# Patient Record
Sex: Female | Born: 1975 | Race: White | Hispanic: No | Marital: Married | Smoking: Never smoker
Health system: Southern US, Community
[De-identification: ages and names within clinical notes are randomized; demographics above are authoritative.]

## PROBLEM LIST (undated history)

## (undated) DIAGNOSIS — T7840XA Allergy, unspecified, initial encounter: Secondary | ICD-10-CM

## (undated) DIAGNOSIS — C50919 Malignant neoplasm of unspecified site of unspecified female breast: Secondary | ICD-10-CM

## (undated) HISTORY — DX: Malignant neoplasm of unspecified site of unspecified female breast: C50.919

## (undated) HISTORY — PX: BREAST SURGERY: SHX581

## (undated) HISTORY — DX: Allergy, unspecified, initial encounter: T78.40XA

## (undated) HISTORY — PX: MASTECTOMY: SHX3

---

## 2010-11-19 ENCOUNTER — Encounter
Admission: RE | Admit: 2010-11-19 | Discharge: 2010-11-19 | Payer: Self-pay | Source: Home / Self Care | Attending: Family Medicine | Admitting: Family Medicine

## 2011-09-18 IMAGING — CT CT CHEST W/ CM
3 of 4 series · 17 of 30 positions shown, 19 images · IV contrast (75CC OMNI 300)
Comparison: None

CLINICAL DATA: The supraclavicular lymph nodes.  History of breast
cancer.

CT CHEST WITH CONTRAST
TECHNIQUE: Multidetector CT imaging of the chest was performed
following the standard protocol during bolus administration of
intravenous contrast.
Contrast: 75 ml [id number].

[Series 3: routine chest · axial · 0.70mm/px · z∈[-202,+18]mm · 5 of 68 slices shown, 7 images]
[im 12/68  mediastinal]
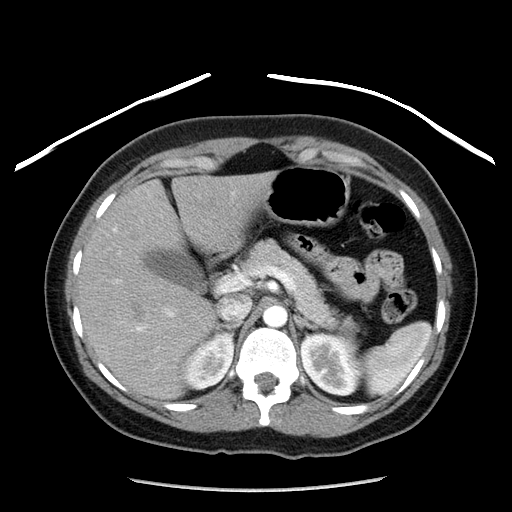
[im 12/68  lung]
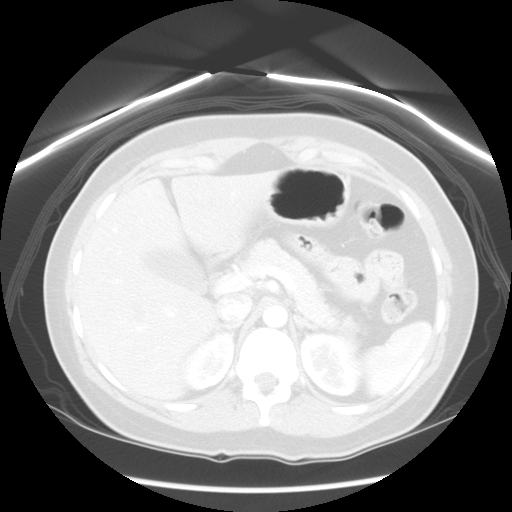
[im 23/68  lung]
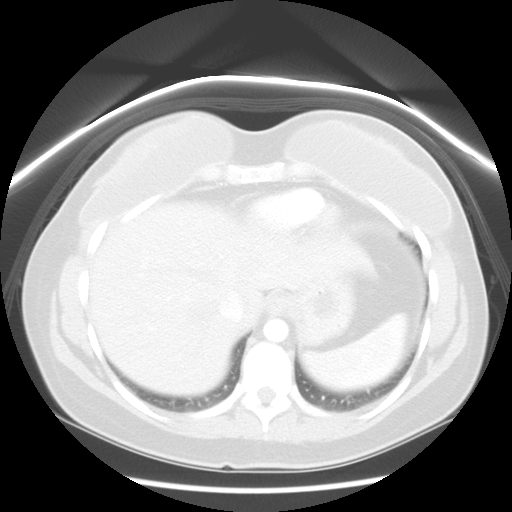
[im 34/68  lung]
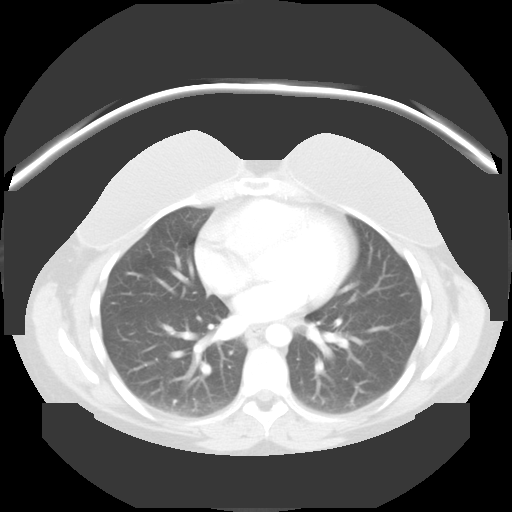
[im 45/68  lung]
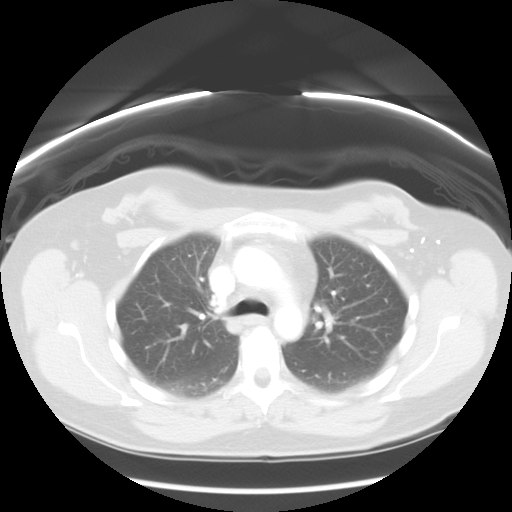
[im 56/68  mediastinal]
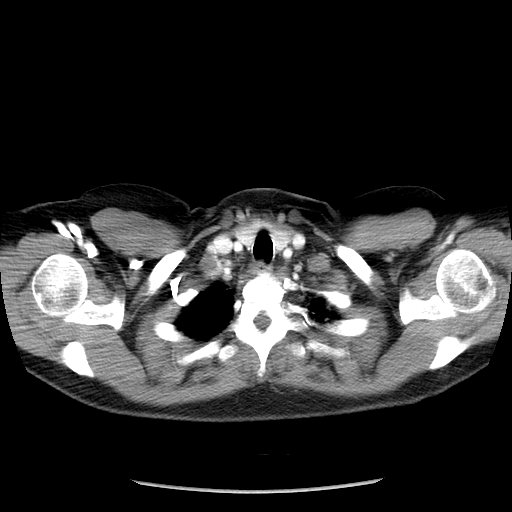
[im 56/68  lung]
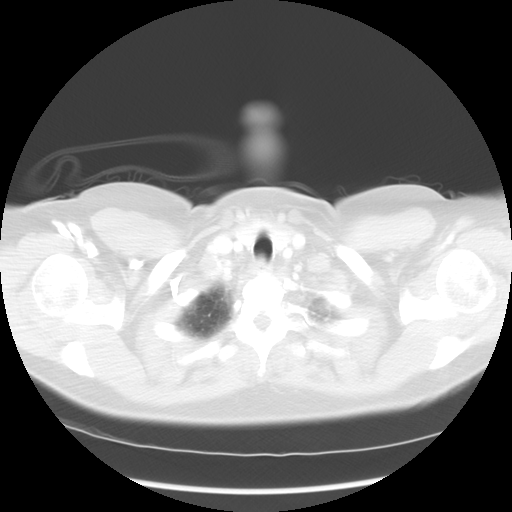

[Series 4: lung windows · axial · 0.70mm/px · z∈[-147,+18]mm · 4 of 57 slices shown]
[im 12/57  lung]
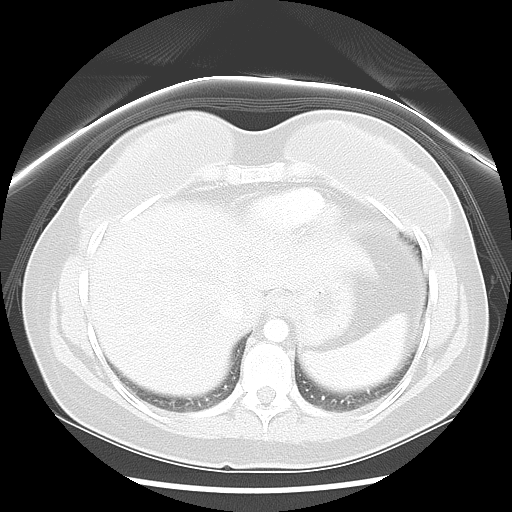
[im 23/57  lung]
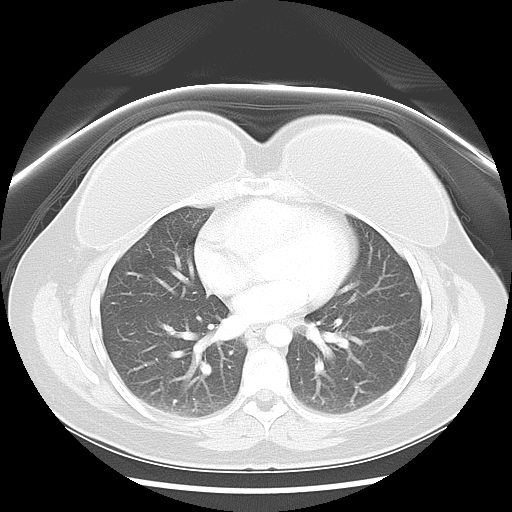
[im 34/57  lung]
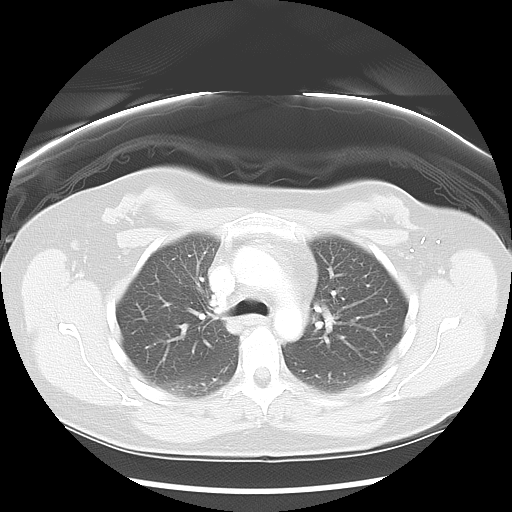
[im 45/57  lung]
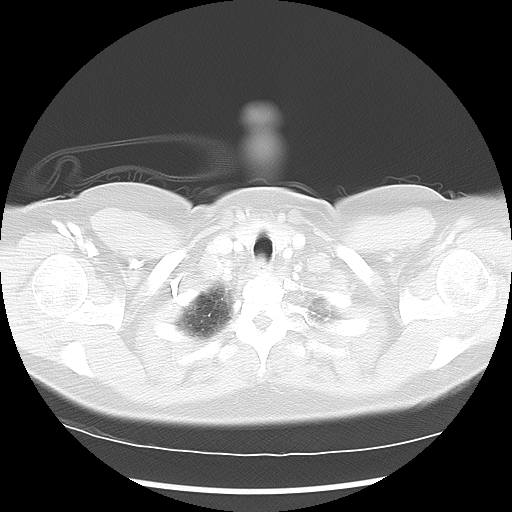

[Series 602: sagittal body · sagittal · 0.70mm/px · 8 of 145 slices shown]
[im 11/145  mediastinal]
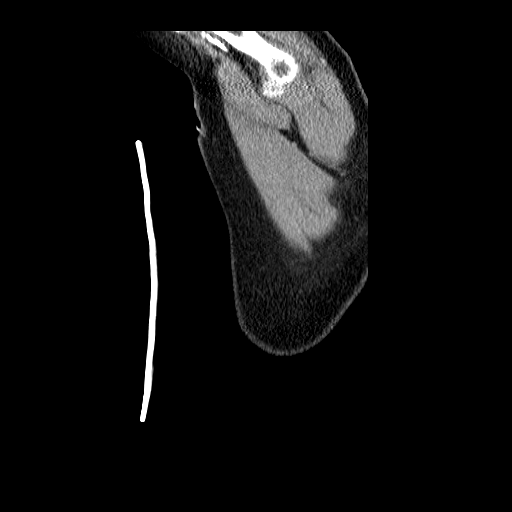
[im 31/145  mediastinal]
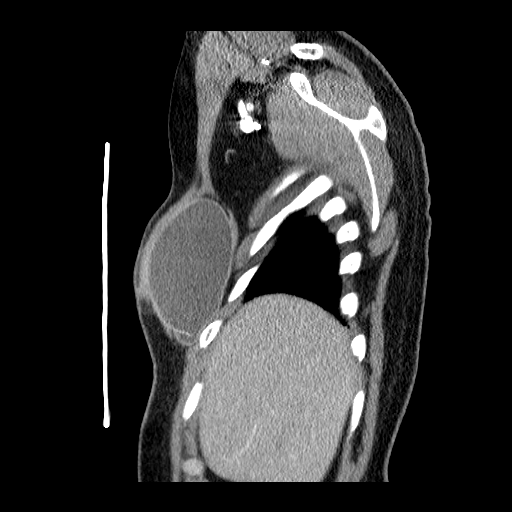
[im 52/145  mediastinal]
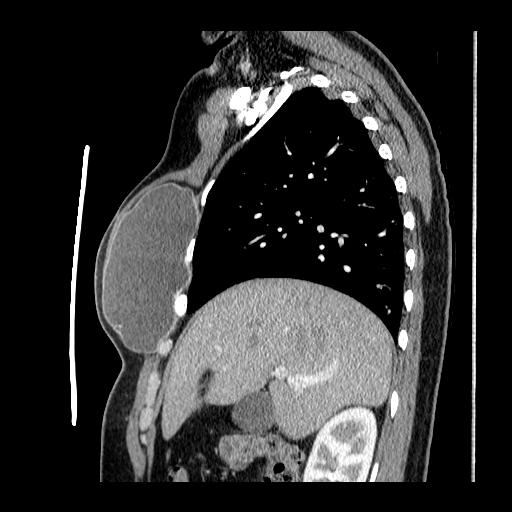
[im 62/145  mediastinal]
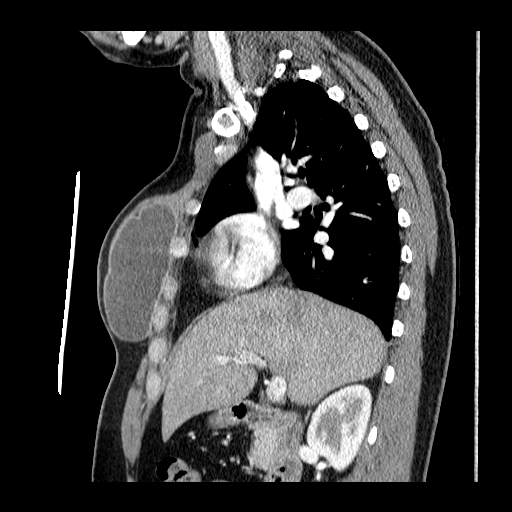
[im 83/145  mediastinal]
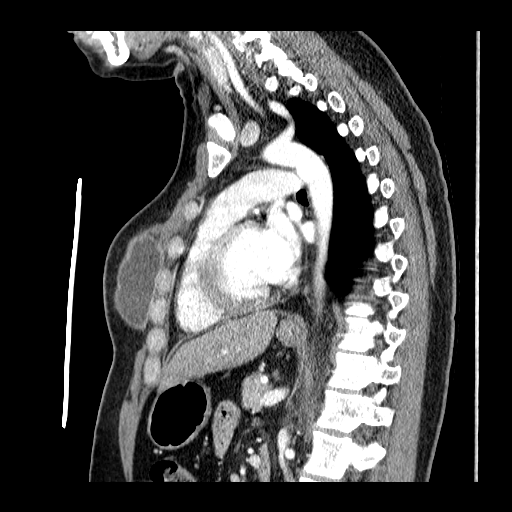
[im 93/145  mediastinal]
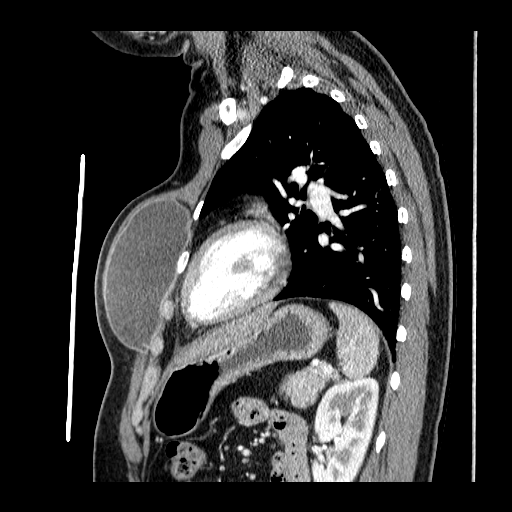
[im 114/145  mediastinal]
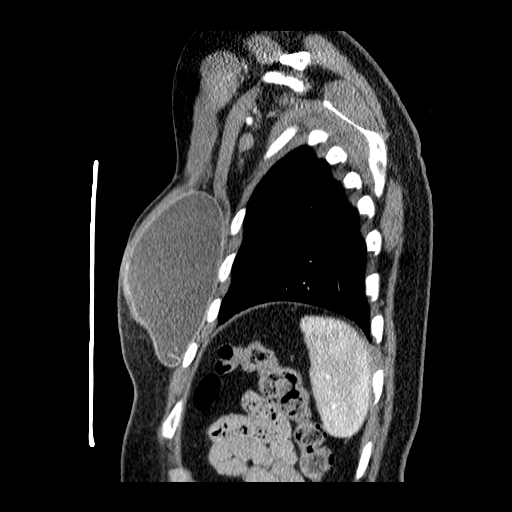
[im 134/145  mediastinal]
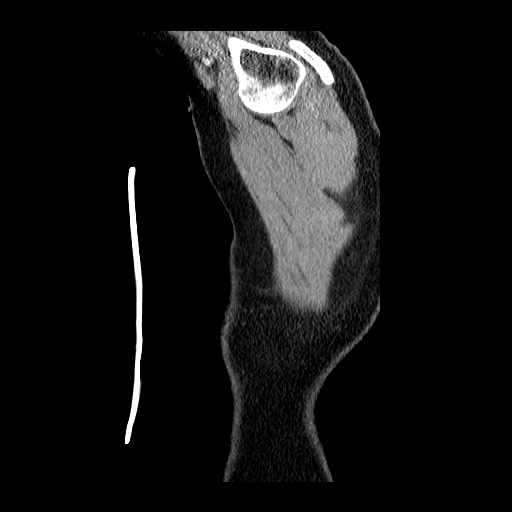

[17 of 30 positions shown; findings below may reference images not displayed]

FINDINGS: There are a few small left-sided lower cervical and
supraclavicular lymph nodes but no adenopathy.  The thyroid gland
appears normal.  No axillary lymphadenopathy.  Surgical changes are
noted in the left axilla.  There are bilateral breast prosthesis.

The bony thorax is intact.  No destructive bony lesions.

The heart is normal in size.  No pericardial effusion.  No
mediastinal or hilar adenopathy.  The aorta is normal in caliber.
No dissection.  The major branch vessels are normal.  The esophagus
is unremarkable.

Examination of the lung parenchyma demonstrates no acute pulmonary
findings.  No mass lesions.  No pleural effusion.

The upper abdomen demonstrates diffuse fatty infiltration of the
liver but no focal hepatic lesions.  No upper abdominal adenopathy.
IMPRESSION: 1.  Small left-sided lower cervical and supraclavicular lymph nodes
but no adenopathy.
2.  Surgical changes in the left axilla without findings for mass
or adenopathy.
3.  No acute pulmonary findings.

## 2013-12-08 ENCOUNTER — Ambulatory Visit (INDEPENDENT_AMBULATORY_CARE_PROVIDER_SITE_OTHER): Payer: BC Managed Care – PPO | Admitting: General Practice

## 2013-12-08 ENCOUNTER — Encounter: Payer: Self-pay | Admitting: General Practice

## 2013-12-08 VITALS — BP 116/76 | HR 89 | Temp 98.5°F | Ht 63.0 in | Wt 166.5 lb

## 2013-12-08 DIAGNOSIS — J0100 Acute maxillary sinusitis, unspecified: Secondary | ICD-10-CM

## 2013-12-08 MED ORDER — AZITHROMYCIN 250 MG PO TABS: ORAL_TABLET | ORAL | Status: DC

## 2013-12-08 NOTE — Patient Instructions (Addendum)

## 2013-12-08 NOTE — Progress Notes (Signed)
   Subjective:    Patient ID: Erica Murphy, female    DOB: 1975/11/05, 38 y.o.   MRN: [id number]  Sinusitis This is a new problem. The current episode started yesterday. The problem has been gradually worsening since onset. The maximum temperature recorded prior to her arrival was 100 - 100.9 F. Associated symptoms include congestion and sinus pressure. Pertinent negatives include no chills, coughing, headaches or shortness of breath. Past treatments include nothing.      Review of Systems  Constitutional: Positive for fever. Negative for chills.  HENT: Positive for congestion and sinus pressure.   Respiratory: Negative for cough, chest tightness and shortness of breath.   Cardiovascular: Negative for chest pain and palpitations.  Neurological: Negative for dizziness, weakness and headaches.       Objective:   Physical Exam  Constitutional: She appears well-developed and well-nourished.  HENT:  Head: Normocephalic and atraumatic.  Nose: Right sinus exhibits maxillary sinus tenderness and frontal sinus tenderness. Left sinus exhibits maxillary sinus tenderness and frontal sinus tenderness.  Mouth/Throat: Posterior oropharyngeal erythema present.  Cardiovascular: Normal rate, regular rhythm and normal heart sounds.   No murmur heard. Pulmonary/Chest: Effort normal and breath sounds normal. No respiratory distress. She exhibits no tenderness.  Neurological: She is alert.  Skin: Skin is warm and dry. No rash noted.  Psychiatric: She has a normal mood and affect.          Assessment & Plan:  1. Sinusitis, acute maxillary  - azithromycin (ZITHROMAX) 250 MG tablet; Take as directed  Dispense: 6 tablet; Refill: 0 -increase fluids -rest -RTO if symptoms worsen or unresolved -Patient verbalized understanding Erby Pian, FNP-C

## 2014-05-30 ENCOUNTER — Ambulatory Visit (INDEPENDENT_AMBULATORY_CARE_PROVIDER_SITE_OTHER): Payer: BC Managed Care – PPO | Admitting: Family Medicine

## 2014-05-30 VITALS — BP 117/74 | HR 74 | Temp 98.4°F | Ht 63.0 in | Wt 152.8 lb

## 2014-05-30 DIAGNOSIS — L255 Unspecified contact dermatitis due to plants, except food: Secondary | ICD-10-CM

## 2014-05-30 DIAGNOSIS — L237 Allergic contact dermatitis due to plants, except food: Secondary | ICD-10-CM

## 2014-05-30 MED ORDER — HYDROXYZINE HCL 25 MG PO TABS: 25.0000 mg | ORAL_TABLET | Freq: Three times a day (TID) | ORAL | Status: DC | PRN

## 2014-05-30 MED ORDER — METHYLPREDNISOLONE ACETATE 80 MG/ML IJ SUSP
80.0000 mg | Freq: Once | INTRAMUSCULAR | Status: AC
  Administered 2014-05-30: 80 mg via INTRAMUSCULAR

## 2014-05-30 NOTE — Progress Notes (Signed)
   Subjective:    Patient ID: Erica Murphy, female    DOB: [birth year], 38 y.o.   MRN: [id number]  HPI C/o rash on neck and face from poison ivy exposure.   Review of Systems    No chest pain, SOB, HA, dizziness, vision change, N/V, diarrhea, constipation, dysuria, urinary urgency or frequency, myalgias, arthralgias or rash.  Objective:   Physical Exam  Erythematous rash in patches on neck, left cheek, and left upper eye lid      Assessment & Plan:  Poison ivy - Plan: hydrOXYzine (ATARAX/VISTARIL) 25 MG tablet, methylPREDNISolone acetate (DEPO-MEDROL) injection 80 mg  Lysbeth Penner FNP

## 2014-06-01 ENCOUNTER — Telehealth: Payer: Self-pay | Admitting: Family Medicine

## 2014-06-01 MED ORDER — PREDNISONE 20 MG PO TABS: ORAL_TABLET | ORAL | Status: DC

## 2014-06-01 NOTE — Telephone Encounter (Signed)
ntbs could be chicken poxp

## 2014-06-01 NOTE — Telephone Encounter (Signed)
Pt said she has had chickenpox before and it is poison oak. Wants RX called in [place]. Going out of town tom?

## 2014-06-01 NOTE — Telephone Encounter (Signed)
Saw [facility] on Monday and was given a shot for poison oak and also vistaril. States she is no better and new places are popping up. Is there anything else she can take or does she NTBS again. Please advise

## 2015-03-03 ENCOUNTER — Ambulatory Visit (INDEPENDENT_AMBULATORY_CARE_PROVIDER_SITE_OTHER): Payer: BC Managed Care – PPO | Admitting: Physician Assistant

## 2015-03-03 ENCOUNTER — Encounter: Payer: Self-pay | Admitting: Physician Assistant

## 2015-03-03 ENCOUNTER — Encounter (INDEPENDENT_AMBULATORY_CARE_PROVIDER_SITE_OTHER): Payer: Self-pay

## 2015-03-03 VITALS — BP 120/74 | HR 89 | Temp 97.8°F | Ht 63.0 in | Wt 162.0 lb

## 2015-03-03 DIAGNOSIS — J069 Acute upper respiratory infection, unspecified: Secondary | ICD-10-CM

## 2015-03-03 DIAGNOSIS — J019 Acute sinusitis, unspecified: Secondary | ICD-10-CM

## 2015-03-03 MED ORDER — AMOXICILLIN 875 MG PO TABS: 875.0000 mg | ORAL_TABLET | Freq: Two times a day (BID) | ORAL | Status: DC

## 2015-03-03 MED ORDER — CETIRIZINE HCL 10 MG PO TABS: 10.0000 mg | ORAL_TABLET | Freq: Every day | ORAL | Status: DC

## 2015-03-03 MED ORDER — FLUTICASONE PROPIONATE 50 MCG/ACT NA SUSP: 2.0000 | Freq: Every day | NASAL | Status: DC

## 2015-03-03 NOTE — Patient Instructions (Signed)
-   Take meds as prescribed - Use a cool mist humidifier  -Use saline nose sprays frequently -Saline irrigations of the nose can be very helpful if done frequently.  * 4X daily for 1 week*  * Use of a nettie pot can be helpful with this. Follow directions with this* -Force fluids -For any cough or congestion  Use plain Mucinex- regular strength or max strength is fine    -For fever or aches or pains- take tylenol or ibuprofen appropriate for age and weight.  * for fevers greater than 101 orally you may alternate ibuprofen and tylenol every  3 hours. -Throat lozenges if help -New toothbrush in 3 days  [facility], [place]

## 2015-03-03 NOTE — Progress Notes (Signed)
   Subjective:    Patient ID: Erica Murphy, female    DOB: 24-Nov-1975, 39 y.o.   MRN: [id number]  HPI 39 y/o female presents with c/o sinus pain, pressure, tooth pain x 2 days. Has tried otc decongestant and sinus medicine with no relief.    Review of Systems  Constitutional: Positive for fever (low grade ).  HENT: Positive for congestion (nasal ), dental problem (tooth pain ), ear pain, postnasal drip, sinus pressure and sore throat.   Respiratory: Negative for cough and shortness of breath.        Objective:   Physical Exam  Constitutional: She is oriented to person, place, and time. She appears well-developed and well-nourished.  HENT:  Head: Normocephalic.  Right Ear: External ear normal.  Hazy left TM Posterior pharynx erythema bilaterally  No lymphadenopathy Mild TTP maxillary sinuses, more on left   Cardiovascular: Normal rate, regular rhythm and normal heart sounds.  Exam reveals no gallop and no friction rub.   No murmur heard. Pulmonary/Chest: Effort normal and breath sounds normal. No respiratory distress. She has no wheezes.  Neurological: She is alert and oriented to person, place, and time.  Psychiatric: She has a normal mood and affect. Her behavior is normal. Judgment and thought content normal.  Nursing note and vitals reviewed.         Assessment & Plan:  1. Acute sinusitis, recurrence not specified, unspecified location  - amoxicillin (AMOXIL) 875 MG tablet; Take 1 tablet (875 mg total) by mouth 2 (two) times daily.  Dispense: 20 tablet; Refill: 0 - fluticasone (FLONASE) 50 MCG/ACT nasal spray; Place 2 sprays into both nostrils daily.  Dispense: 16 g; Refill: 6 - cetirizine (ZYRTEC) 10 MG tablet; Take 1 tablet (10 mg total) by mouth daily.  Dispense: 30 tablet; Refill: 11 - Plain otc musinex  - saline spray as directed or netti pot - cool mist humidifier  2. Acute upper respiratory infection  - amoxicillin (AMOXIL) 875 MG tablet; Take 1 tablet  (875 mg total) by mouth 2 (two) times daily.  Dispense: 20 tablet; Refill: 0 - fluticasone (FLONASE) 50 MCG/ACT nasal spray; Place 2 sprays into both nostrils daily.  Dispense: 16 g; Refill: 6 - cetirizine (ZYRTEC) 10 MG tablet; Take 1 tablet (10 mg total) by mouth daily.  Dispense: 30 tablet; Refill: [place]. Benjamin Stain PA-C

## 2015-08-21 ENCOUNTER — Encounter: Payer: Self-pay | Admitting: Family Medicine

## 2015-08-21 ENCOUNTER — Ambulatory Visit (INDEPENDENT_AMBULATORY_CARE_PROVIDER_SITE_OTHER): Payer: BC Managed Care – PPO | Admitting: Family Medicine

## 2015-08-21 VITALS — BP 118/74 | HR 78 | Temp 97.8°F | Ht 63.0 in | Wt 171.0 lb

## 2015-08-21 DIAGNOSIS — R1011 Right upper quadrant pain: Secondary | ICD-10-CM

## 2015-08-21 DIAGNOSIS — K640 First degree hemorrhoids: Secondary | ICD-10-CM

## 2015-08-21 MED ORDER — HYDROCORTISONE 1 % EX CREA: 1.0000 "application " | TOPICAL_CREAM | Freq: Two times a day (BID) | CUTANEOUS | Status: DC

## 2015-08-21 NOTE — Progress Notes (Signed)
BP 118/74 mmHg  Pulse 78  Temp(Src) 97.8 F (36.6 C) (Oral)  Ht '5\' 3"'  (1.6 m)  Wt 171 lb (77.565 kg)  BMI 30.30 kg/m2  LMP 04/19/2015 (Approximate)   Subjective:    Patient ID: Erica Murphy, female    DOB: 1976/01/15, 39 y.o.   MRN: [id number]  HPI: Erica Murphy is a 39 y.o. female presenting on 08/21/2015 for Abdominal Pain; Diarrhea; and Nausea   HPI Right upper quadrant abdominal pain Patient has intermittent colicky right upper quadrant abdominal pain that radiates to her right side. She denies any fevers or chills. This all began 2 weeks ago and it seems to gotten worse over the past few days. She has difficulty associating it with food but she has been having a lot of nausea and not wanting to eat as much as she usually does. She usually only has a bowel movement twice per week but now she's been having them 2-3 times per day and they have been loose and like diarrhea. She has occasional blood streaks in stool and when she wipes and is concerned over whether or not she has hemorrhoids because of her constipation and diarrhea.  Hemorrhoid Patient has been having some hemorrhoids because of her diarrhea and previous constipation. She gets some bleeding on the toilet paper when she wipes but none in the stool.  Relevant past medical, surgical, family and social history reviewed and updated as indicated. Interim medical history since our last visit reviewed. Allergies and medications reviewed and updated.  Review of Systems  Constitutional: Negative for fever and chills.  HENT: Negative for congestion, ear discharge and ear pain.   Eyes: Negative for redness and visual disturbance.  Respiratory: Negative for chest tightness and shortness of breath.   Cardiovascular: Negative for chest pain and leg swelling.  Gastrointestinal: Positive for nausea, abdominal pain, diarrhea, anal bleeding and rectal pain. Negative for vomiting, blood in stool and abdominal distention.    Genitourinary: Negative for dysuria and difficulty urinating.  Musculoskeletal: Negative for back pain and gait problem.  Skin: Negative for rash.  Neurological: Negative for light-headedness and headaches.  Psychiatric/Behavioral: Negative for behavioral problems and agitation.  All other systems reviewed and are negative.   Per HPI unless specifically indicated above     Medication List       This list is accurate as of: 08/21/15  2:51 PM.  Always use your most recent med list.               cetirizine 10 MG tablet  Commonly known as:  ZYRTEC  Take 1 tablet (10 mg total) by mouth daily.     hydrocortisone cream 1 %  Apply 1 application topically 2 (two) times daily.           Objective:    BP 118/74 mmHg  Pulse 78  Temp(Src) 97.8 F (36.6 C) (Oral)  Ht '5\' 3"'  (1.6 m)  Wt 171 lb (77.565 kg)  BMI 30.30 kg/m2  LMP 04/19/2015 (Approximate)  Wt Readings from Last 3 Encounters:  08/21/15 171 lb (77.565 kg)  03/03/15 162 lb (73.483 kg)  05/30/14 152 lb 12.8 oz (69.31 kg)    Physical Exam  Constitutional: She is oriented to person, place, and time. She appears well-developed and well-nourished. No distress.  Eyes: Conjunctivae and EOM are normal. Pupils are equal, round, and reactive to light.  Cardiovascular: Normal rate and regular rhythm.   No murmur heard. Pulmonary/Chest: Effort normal and breath sounds normal. No  respiratory distress. She has no wheezes.  Abdominal: Soft. Bowel sounds are normal. She exhibits no distension and no mass. There is tenderness (right upper quadrant). There is no rebound and no guarding.  Genitourinary: Rectal exam shows external hemorrhoid and tenderness. Rectal exam shows no mass.    Musculoskeletal: Normal range of motion. She exhibits no edema or tenderness.  Neurological: She is alert and oriented to person, place, and time. Coordination normal.  Skin: Skin is warm and dry. No rash noted. She is not diaphoretic.   Psychiatric: She has a normal mood and affect. Her behavior is normal.  Vitals reviewed.   No results found for this or any previous visit.    Assessment & Plan:   Problem List Items Addressed This Visit    None    Visit Diagnoses    RUQ abdominal pain    -  Primary    2 weeks of colicky right upper quadrant abdominal pain, recommended Zantac but will also do ultrasound.    Relevant Orders    CMP14+EGFR (Completed)    Lipase (Completed)    US Abdomen Limited RUQ    First degree hemorrhoids        Small single hemorrhoid, will treat with hydrocortisone cream.    Relevant Medications    hydrocortisone cream 1 %        Follow up plan: Return in about 2 weeks (around 09/04/2015), or if symptoms worsen or fail to improve, for return for abd pain, discuss results.  Caryl Pina, MD [facility] 08/21/2015, 2:51 PM

## 2015-08-22 LAB — CMP14+EGFR
ALK PHOS: 56 IU/L (ref 39–117)
ALT: 17 IU/L (ref 0–32)
AST: 15 IU/L (ref 0–40)
Albumin/Globulin Ratio: 1.5 (ref 1.1–2.5)
Albumin: 4.3 g/dL (ref 3.5–5.5)
BILIRUBIN TOTAL: 0.4 mg/dL (ref 0.0–1.2)
BUN/Creatinine Ratio: 21 (ref 9–23)
BUN: 15 mg/dL (ref 6–24)
CHLORIDE: 103 mmol/L (ref 97–106)
CO2: 27 mmol/L (ref 18–29)
Calcium: 9.7 mg/dL (ref 8.7–10.2)
Creatinine, Ser: 0.71 mg/dL (ref 0.57–1.00)
GFR calc Af Amer: 122 mL/min/{1.73_m2} (ref >59)
GFR calc non Af Amer: 106 mL/min/{1.73_m2} (ref >59)
GLUCOSE: 93 mg/dL (ref 65–99)
Globulin, Total: 2.8 g/dL (ref 1.5–4.5)
Potassium: 4.9 mmol/L (ref 3.5–5.2)
Sodium: 143 mmol/L (ref 136–144)
TOTAL PROTEIN: 7.1 g/dL (ref 6.0–8.5)

## 2015-08-22 LAB — LIPASE: LIPASE: 37 U/L (ref 0–59)

## 2015-08-28 ENCOUNTER — Ambulatory Visit (HOSPITAL_COMMUNITY)
Admission: RE | Admit: 2015-08-28 | Discharge: 2015-08-28 | Disposition: A | Discharge status: Home / Self Care | Payer: BC Managed Care – PPO | Source: Ambulatory Visit | Attending: Family Medicine | Admitting: Family Medicine

## 2015-08-28 DIAGNOSIS — R1011 Right upper quadrant pain: Secondary | ICD-10-CM | POA: Insufficient documentation

## 2015-08-28 DIAGNOSIS — R112 Nausea with vomiting, unspecified: Secondary | ICD-10-CM | POA: Insufficient documentation

## 2015-09-04 ENCOUNTER — Ambulatory Visit: Payer: BC Managed Care – PPO | Admitting: Family Medicine

## 2016-04-29 ENCOUNTER — Encounter: Payer: Self-pay | Admitting: Physician Assistant

## 2016-04-29 ENCOUNTER — Ambulatory Visit (INDEPENDENT_AMBULATORY_CARE_PROVIDER_SITE_OTHER): Payer: BC Managed Care – PPO | Admitting: Physician Assistant

## 2016-04-29 VITALS — BP 112/77 | HR 83 | Temp 97.5°F | Ht 63.0 in | Wt 175.8 lb

## 2016-04-29 DIAGNOSIS — M25512 Pain in left shoulder: Secondary | ICD-10-CM

## 2016-04-29 DIAGNOSIS — J069 Acute upper respiratory infection, unspecified: Secondary | ICD-10-CM

## 2016-04-29 MED ORDER — CETIRIZINE HCL 10 MG PO TABS: 10.0000 mg | ORAL_TABLET | Freq: Every day | ORAL | Status: AC

## 2016-04-29 NOTE — Progress Notes (Signed)
Subjective:     Patient ID: Erica Murphy, female   DOB: January 16, 1976, 40 y.o.   MRN: [id number]  HPI Pt with a several day hx of L shoulder/axillary pain Hx of double mastectomy and multiple lymph node removal from the L axillary area Removal 12 yrs ago Pt took some Naprosyn and sx have improved No known injury  Review of Systems + pain to the L shoulder with adduction No radiation to the L arm No change in strength to the L upper ext    Objective:   Physical Exam NAD Some generalized TTP of the L axilla, chest wall No area of rash, erythema or induration No palp nodes FROM of the shoulder Sl sx with adduction Strength good distal Pulses/sensory good    Assessment:     L shoulder pain    Plan:     Since NSAIDS seem th help with give these a little more time Heat/Ice Activities as tol Given hx discussed I would have a low threshold for further studies If sx have not resolved in 1 week to f/u for eval

## 2016-04-29 NOTE — Patient Instructions (Signed)

## 2017-06-13 ENCOUNTER — Encounter: Payer: Self-pay | Admitting: Family Medicine

## 2017-06-13 ENCOUNTER — Ambulatory Visit (INDEPENDENT_AMBULATORY_CARE_PROVIDER_SITE_OTHER): Payer: BC Managed Care – PPO | Admitting: Family Medicine

## 2017-06-13 VITALS — BP 121/82 | HR 95 | Temp 97.6°F | Ht 63.0 in | Wt 174.0 lb

## 2017-06-13 DIAGNOSIS — E049 Nontoxic goiter, unspecified: Secondary | ICD-10-CM

## 2017-06-13 DIAGNOSIS — Z131 Encounter for screening for diabetes mellitus: Secondary | ICD-10-CM

## 2017-06-13 DIAGNOSIS — Z13220 Encounter for screening for lipoid disorders: Secondary | ICD-10-CM | POA: Diagnosis not present

## 2017-06-13 NOTE — Progress Notes (Signed)
BP 121/82   Pulse 95   Temp 97.6 F (36.4 C) (Oral)   Ht '5\' 3"'  (1.6 m)   Wt 174 lb (78.9 kg)   BMI 30.82 kg/m    Subjective:    Patient ID: Erica Murphy, female    DOB: 03/04/1976, 41 y.o.   MRN: [id number]  HPI: Erica Murphy is a 41 y.o. female presenting on 06/13/2017 for Thyroid Problem (pt here today to have her thyroid checked after the dentist told her to f/u with PCP because her thyroid felt enlarged.)   HPI Hypothyroidism Enlargement Patient is coming in for thyroid enlargement. They deny any issues with hair changes or heat or cold problems or diarrhea or constipation. They deny any chest pain or palpitations. Patient does complain about fatigue.  Relevant past medical, surgical, family and social history reviewed and updated as indicated. Interim medical history since our last visit reviewed. Allergies and medications reviewed and updated.  Review of Systems  Constitutional: Positive for fatigue. Negative for chills and fever.  Eyes: Negative for visual disturbance.  Respiratory: Negative for chest tightness and shortness of breath.   Cardiovascular: Negative for chest pain and leg swelling.  Endocrine: Negative for cold intolerance, heat intolerance, polydipsia and polyuria.  Musculoskeletal: Negative for back pain and gait problem.  Skin: Negative for color change and rash.  Neurological: Negative for dizziness, weakness, light-headedness, numbness and headaches.  Psychiatric/Behavioral: Negative for agitation and behavioral problems.  All other systems reviewed and are negative.   Per HPI unless specifically indicated above      Objective:    BP 121/82   Pulse 95   Temp 97.6 F (36.4 C) (Oral)   Ht '5\' 3"'  (1.6 m)   Wt 174 lb (78.9 kg)   BMI 30.82 kg/m   Wt Readings from Last 3 Encounters:  06/13/17 174 lb (78.9 kg)  04/29/16 175 lb 12.8 oz (79.7 kg)  08/21/15 171 lb (77.6 kg)    Physical Exam  Constitutional: She is oriented to person,  place, and time. She appears well-developed and well-nourished. No distress.  Eyes: Conjunctivae are normal.  Neck: Neck supple. Thyromegaly (Smooth and diffuse, slightly enlarged) present.  Cardiovascular: Normal rate, regular rhythm, normal heart sounds and intact distal pulses.   No murmur heard. Pulmonary/Chest: Effort normal and breath sounds normal. No respiratory distress. She has no wheezes. She has no rales.  Musculoskeletal: Normal range of motion. She exhibits no edema or tenderness.  Lymphadenopathy:    She has no cervical adenopathy.  Neurological: She is alert and oriented to person, place, and time. Coordination normal.  Skin: Skin is warm and dry. No rash noted. She is not diaphoretic.  Psychiatric: She has a normal mood and affect. Her behavior is normal.  Nursing note and vitals reviewed.     Assessment & Plan:   Problem List Items Addressed This Visit    None    Visit Diagnoses    Enlarged thyroid    -  Primary   Relevant Orders   CBC with Differential/Platelet (Completed)   Thyroid Panel With TSH (Completed)   US THYROID (Completed)   Screening for diabetes mellitus       Relevant Orders   CMP14+EGFR (Completed)   Screening for lipid disorders       Relevant Orders   Lipid panel (Completed)       Follow up plan: Return in about 1 year (around 06/13/2018), or if symptoms worsen or fail to improve.  Counseling provided for  all of the vaccine components Orders Placed This Encounter  Procedures  . US THYROID  . CMP14+EGFR  . CBC with Differential/Platelet  . Lipid panel  . Thyroid Panel With TSH    Caryl Pina, MD [facility] 06/13/2017, 5:04 PM

## 2017-06-14 LAB — THYROID PANEL WITH TSH
Free Thyroxine Index: 1.4 (ref 1.2–4.9)
T3 Uptake Ratio: 22 % — ABNORMAL LOW (ref 24–39)
T4, Total: 6.5 ug/dL (ref 4.5–12.0)
TSH: 1.76 u[IU]/mL (ref 0.450–4.500)

## 2017-06-14 LAB — CMP14+EGFR
ALBUMIN: 4.5 g/dL (ref 3.5–5.5)
ALK PHOS: 68 IU/L (ref 39–117)
ALT: 47 IU/L — ABNORMAL HIGH (ref 0–32)
AST: 33 IU/L (ref 0–40)
Albumin/Globulin Ratio: 1.6 (ref 1.2–2.2)
BUN / CREAT RATIO: 16 (ref 9–23)
BUN: 15 mg/dL (ref 6–24)
Bilirubin Total: 0.4 mg/dL (ref 0.0–1.2)
CO2: 25 mmol/L (ref 20–29)
CREATININE: 0.94 mg/dL (ref 0.57–1.00)
Calcium: 9.5 mg/dL (ref 8.7–10.2)
Chloride: 104 mmol/L (ref 96–106)
GFR calc Af Amer: 87 mL/min/{1.73_m2} (ref >59)
GFR calc non Af Amer: 75 mL/min/{1.73_m2} (ref >59)
GLUCOSE: 92 mg/dL (ref 65–99)
Globulin, Total: 2.9 g/dL (ref 1.5–4.5)
Potassium: 4.1 mmol/L (ref 3.5–5.2)
Sodium: 145 mmol/L — ABNORMAL HIGH (ref 134–144)
Total Protein: 7.4 g/dL (ref 6.0–8.5)

## 2017-06-14 LAB — CBC WITH DIFFERENTIAL/PLATELET
Basophils Absolute: 0 10*3/uL (ref 0.0–0.2)
Basos: 1 %
EOS (ABSOLUTE): 0.3 10*3/uL (ref 0.0–0.4)
EOS: 5 %
HEMATOCRIT: 39.5 % (ref 34.0–46.6)
HEMOGLOBIN: 13 g/dL (ref 11.1–15.9)
IMMATURE GRANS (ABS): 0 10*3/uL (ref 0.0–0.1)
IMMATURE GRANULOCYTES: 0 %
LYMPHS: 40 %
Lymphocytes Absolute: 2.6 10*3/uL (ref 0.7–3.1)
MCH: 29.2 pg (ref 26.6–33.0)
MCHC: 32.9 g/dL (ref 31.5–35.7)
MCV: 89 fL (ref 79–97)
MONOCYTES: 12 %
Monocytes Absolute: 0.8 10*3/uL (ref 0.1–0.9)
NEUTROS PCT: 42 %
Neutrophils Absolute: 2.7 10*3/uL (ref 1.4–7.0)
Platelets: 263 10*3/uL (ref 150–379)
RBC: 4.45 x10E6/uL (ref 3.77–5.28)
RDW: 13.8 % (ref 12.3–15.4)
WBC: 6.4 10*3/uL (ref 3.4–10.8)

## 2017-06-14 LAB — LIPID PANEL
CHOL/HDL RATIO: 4 ratio (ref 0.0–4.4)
Cholesterol, Total: 214 mg/dL — ABNORMAL HIGH (ref 100–199)
HDL: 53 mg/dL (ref >39)
LDL CALC: 132 mg/dL — AB (ref 0–99)
TRIGLYCERIDES: 144 mg/dL (ref 0–149)
VLDL Cholesterol Cal: 29 mg/dL (ref 5–40)

## 2017-06-18 ENCOUNTER — Ambulatory Visit (HOSPITAL_COMMUNITY)
Admission: RE | Admit: 2017-06-18 | Discharge: 2017-06-18 | Disposition: A | Discharge status: Home / Self Care | Payer: BC Managed Care – PPO | Source: Ambulatory Visit | Attending: Family Medicine | Admitting: Family Medicine

## 2017-06-18 DIAGNOSIS — E049 Nontoxic goiter, unspecified: Secondary | ICD-10-CM | POA: Insufficient documentation

## 2017-08-13 IMAGING — US US ABDOMEN LIMITED
1 series · 14 of 25 positions shown · non-contrast
Comparison: CT scan of the chest which included portions of the
upper abdomen dated November 19, 2010

CLINICAL DATA: Two and half weeks of colicky right upper quadrant
pain with nausea and vomiting

EXAM:
US ABDOMEN LIMITED - RIGHT UPPER QUADRANT

[Series 1: us abdomen limited · 0.20mm/px · 14 of 49 slices shown]
[im 1/49]
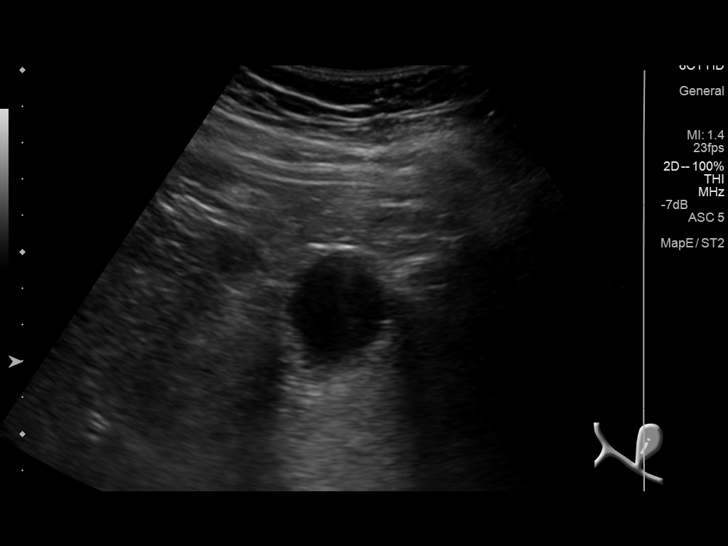
[im 5/49]
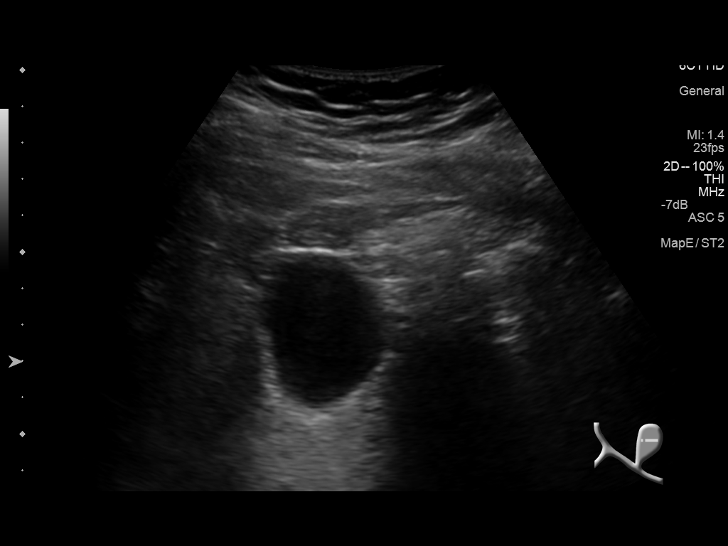
[im 9/49]
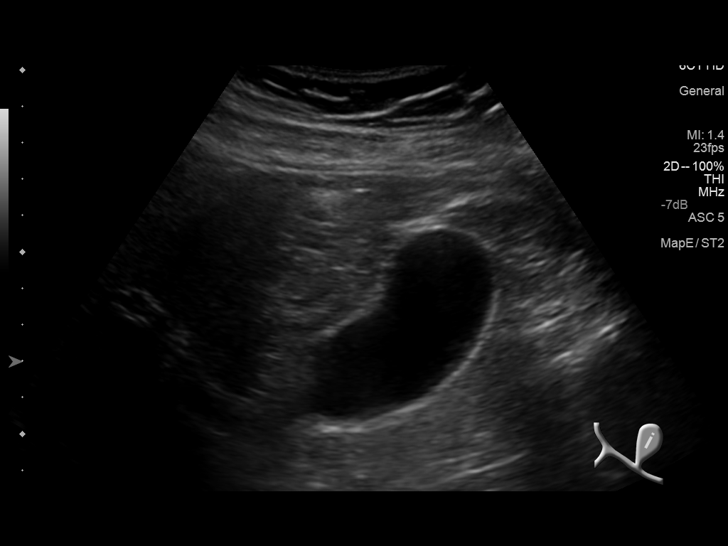
[im 13/49]
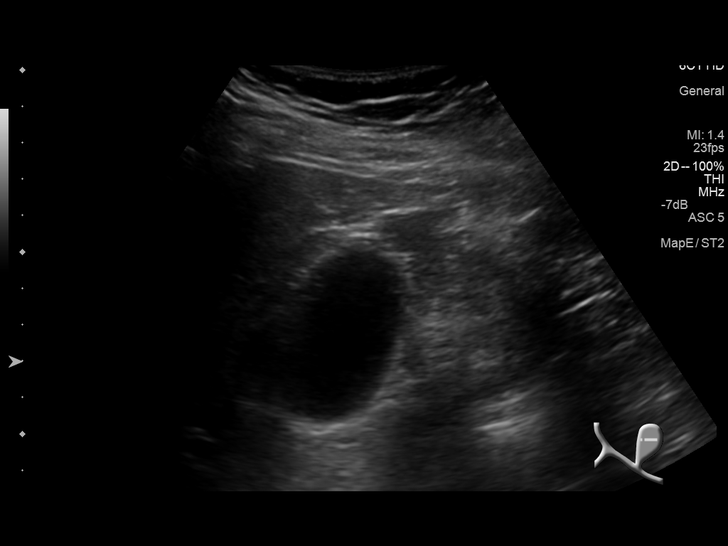
[im 17/49]
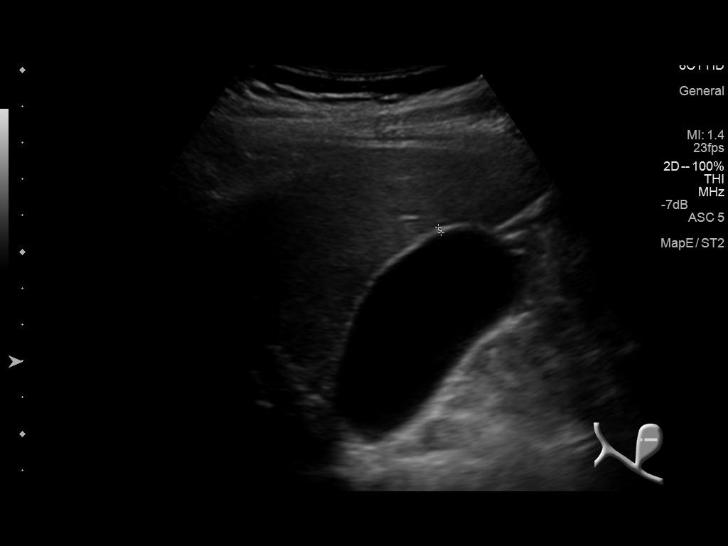
[im 19/49]
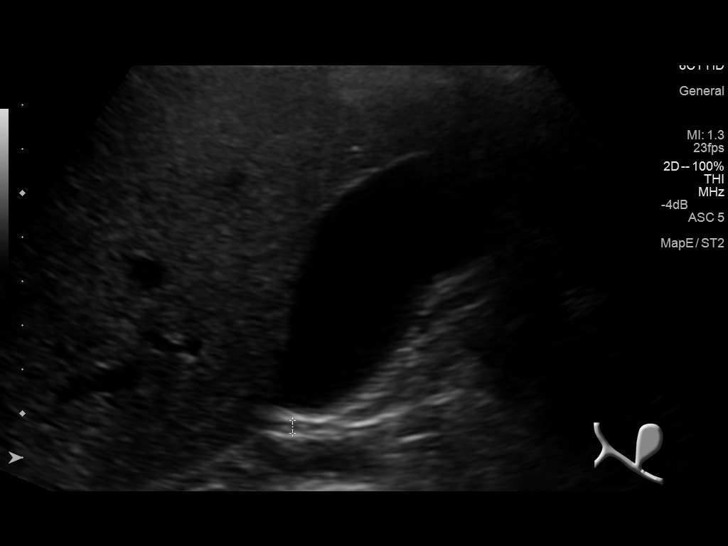
[im 23/49]
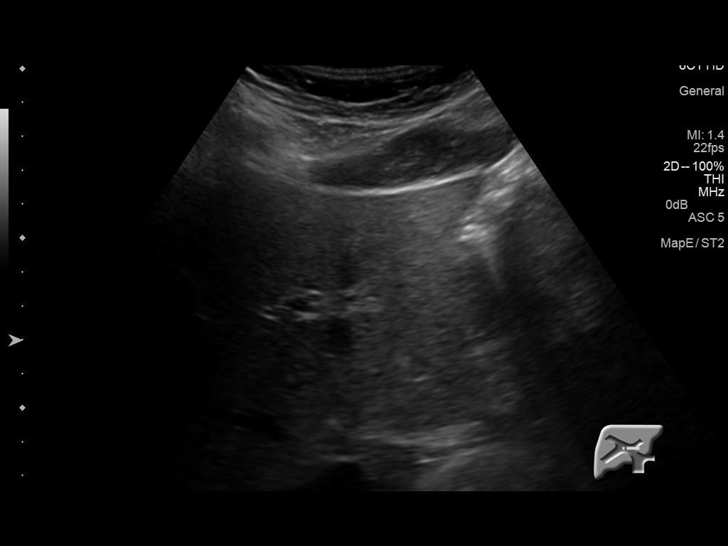
[im 27/49]
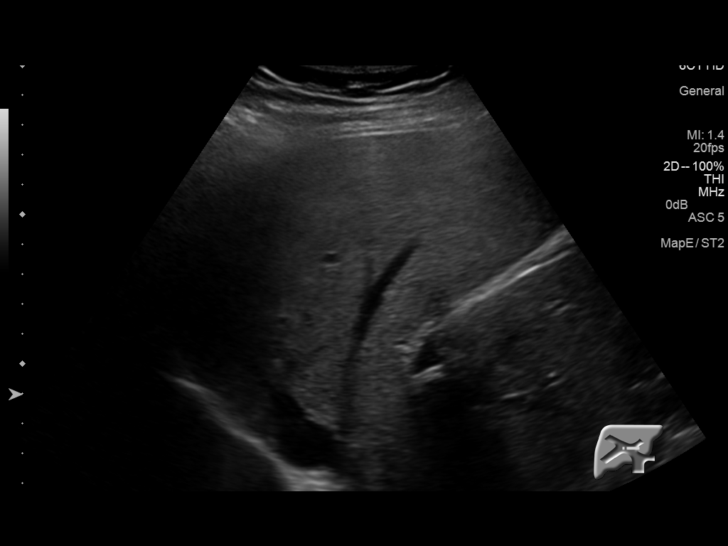
[im 31/49]
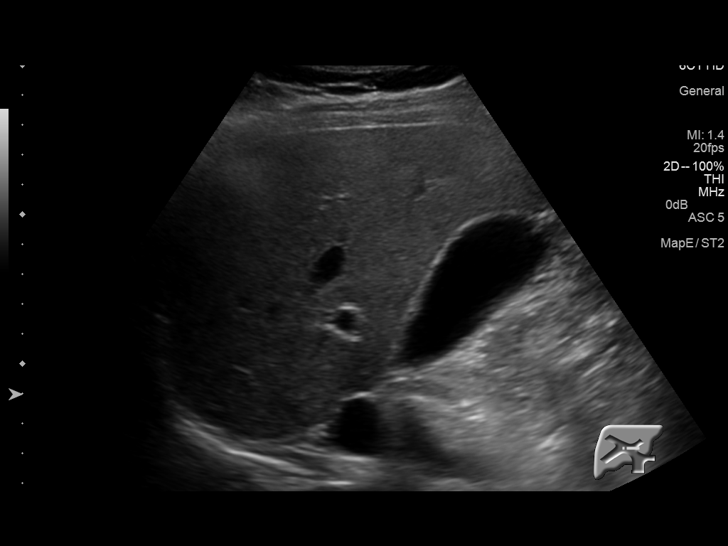
[im 33/49]
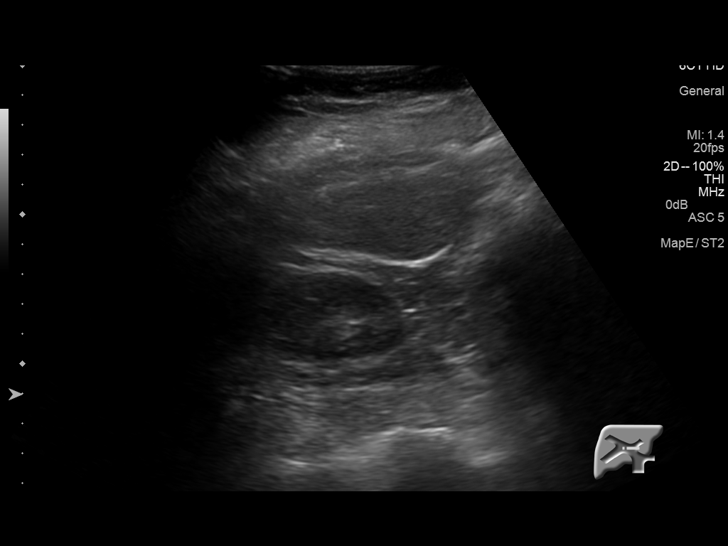
[im 37/49]
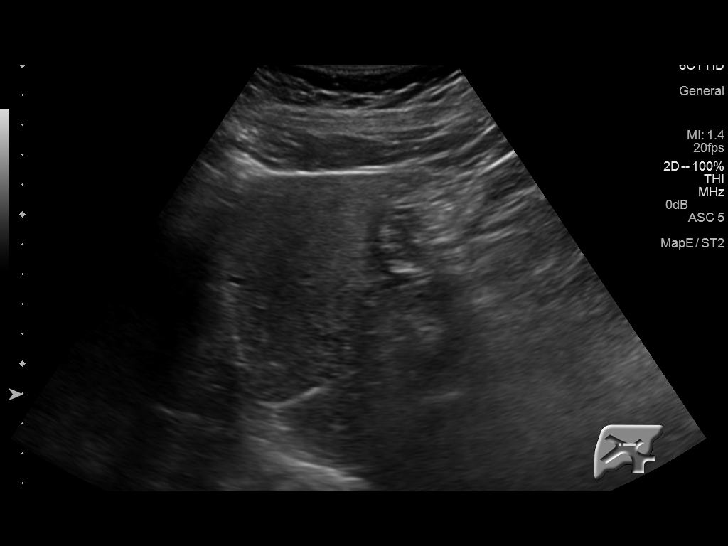
[im 41/49]
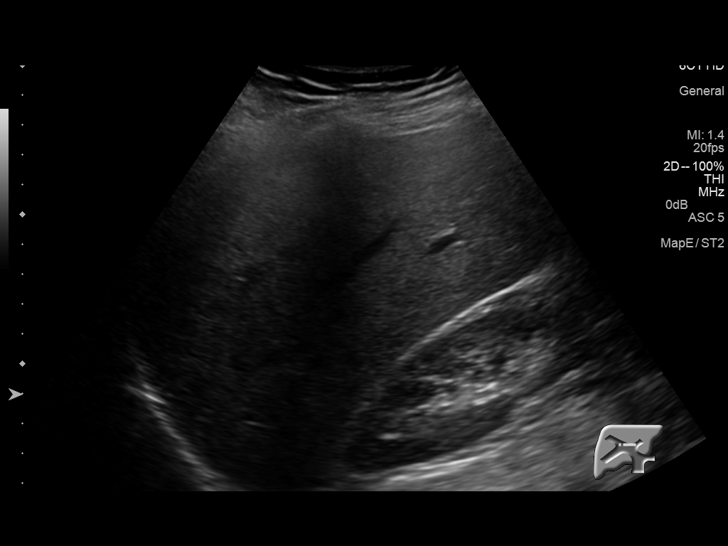
[im 45/49]
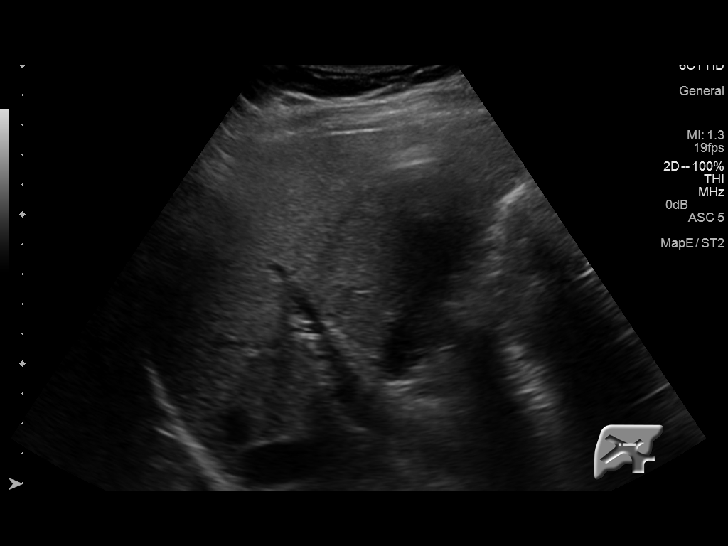
[im 49/49]
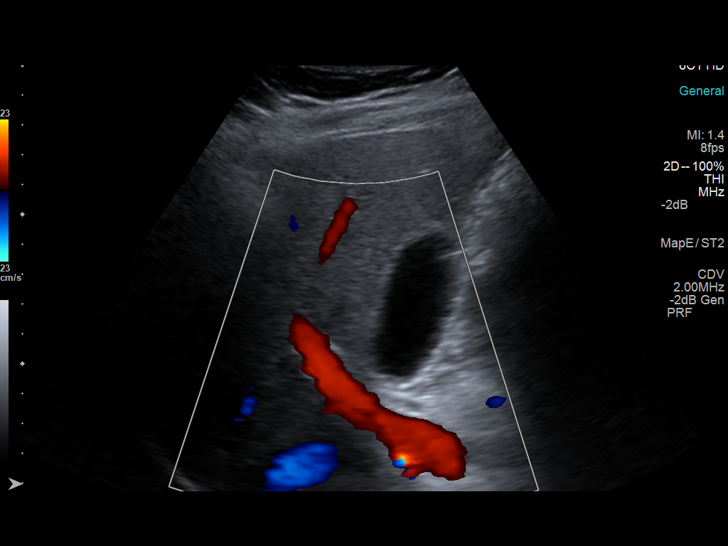

[14 of 25 positions shown; findings below may reference images not displayed]

FINDINGS: Gallbladder:

No gallstones or wall thickening visualized. No sonographic Murphy
sign noted.

Common bile duct:

Diameter: 2.9 mm

Liver:

The hepatic echotexture is normal. There is no focal mass nor ductal
dilation.
IMPRESSION: Normal limited right upper quadrant ultrasound examination.

If gallbladder dysfunction is strongly suspected clinically, a
nuclear medicine hepatobiliary scan may be useful.

## 2017-08-25 ENCOUNTER — Encounter: Payer: Self-pay | Admitting: Family Medicine

## 2017-08-25 ENCOUNTER — Ambulatory Visit (INDEPENDENT_AMBULATORY_CARE_PROVIDER_SITE_OTHER): Payer: BC Managed Care – PPO | Admitting: Family Medicine

## 2017-08-25 VITALS — BP 125/83 | HR 77 | Temp 98.2°F | Ht 63.0 in | Wt 175.0 lb

## 2017-08-25 DIAGNOSIS — R590 Localized enlarged lymph nodes: Secondary | ICD-10-CM

## 2017-08-25 DIAGNOSIS — Z853 Personal history of malignant neoplasm of breast: Secondary | ICD-10-CM

## 2017-08-25 MED ORDER — DOXYCYCLINE HYCLATE 100 MG PO TABS: 100.0000 mg | ORAL_TABLET | Freq: Two times a day (BID) | ORAL | 0 refills | Status: DC

## 2017-08-25 NOTE — Progress Notes (Signed)
BP 125/83   Pulse 77   Temp 98.2 F (36.8 C) (Oral)   Ht 5\' 3"  (1.6 m)   Wt 175 lb (79.4 kg)   BMI 31.00 kg/m    Subjective:    Patient ID: Erica Murphy, female    DOB: March 01, 1976, 41 y.o.   MRN: [id number]  HPI: Erica Murphy is a 41 y.o. female presenting on 08/25/2017 for Swelling under right arm pit (x 1 week, no change since finding)   HPI Right axillary swelling Patient has right axillary swelling that she noticed about a week ago.  She says she has had a recent cold couple weeks ago but has not noticed any swelling or redness or infections anywhere else.  She is over that now and this just popped up a week ago.  She does have a history of a bilateral mastectomy secondary to breast cancer but was cleared many years ago.  She was told she did not need mammograms any further because they took all of the breast tissue.  She denies any fevers or chills or sweats or bruising or bleeding.  She denies any lumps anywhere else.  She has breast implants so is difficult to tell if she has any lumps in her breasts.  Her cancer was on the left side  Relevant past medical, surgical, family and social history reviewed and updated as indicated. Interim medical history since our last visit reviewed. Allergies and medications reviewed and updated.  Review of Systems  Constitutional: Negative for chills and fever.  HENT: Negative for rhinorrhea, sinus pain, sinus pressure and sore throat.   Respiratory: Negative for chest tightness and shortness of breath.   Cardiovascular: Negative for chest pain and leg swelling.  Musculoskeletal: Negative for back pain and gait problem.  Skin: Negative for color change, rash and wound.  Neurological: Negative for light-headedness and headaches.  Psychiatric/Behavioral: Negative for agitation and behavioral problems.  All other systems reviewed and are negative.   Per HPI unless specifically indicated above     Objective:    BP 125/83   Pulse 77    Temp 98.2 F (36.8 C) (Oral)   Ht 5\' 3"  (1.6 m)   Wt 175 lb (79.4 kg)   BMI 31.00 kg/m   Wt Readings from Last 3 Encounters:  08/25/17 175 lb (79.4 kg)  06/13/17 174 lb (78.9 kg)  04/29/16 175 lb 12.8 oz (79.7 kg)    Physical Exam  Constitutional: She is oriented to person, place, and time. She appears well-developed and well-nourished. No distress.  Eyes: Conjunctivae are normal.  Neck: Neck supple. No thyromegaly present.  Cardiovascular: Normal rate, regular rhythm, normal heart sounds and intact distal pulses.   No murmur heard. Pulmonary/Chest: Effort normal and breath sounds normal. No respiratory distress. She has no wheezes. She has no rales.  At the bilateral breast implants with no nipple and surgically had to areola.  No overlying breast skin changes  Musculoskeletal: Normal range of motion. She exhibits no edema or tenderness.  Lymphadenopathy:    She has no cervical adenopathy.       Right cervical: No superficial cervical, no deep cervical and no posterior cervical adenopathy present.   She has axillary adenopathy.       Right axillary: Lateral (2-1/2 cm in size, mobile and nontender) adenopathy present.       Right: No supraclavicular adenopathy present.       Left: No supraclavicular adenopathy present.  Neurological: She is alert and oriented to  person, place, and time. Coordination normal.  Skin: Skin is warm and dry. No rash noted. She is not diaphoretic.  Psychiatric: She has a normal mood and affect. Her behavior is normal.  Nursing note and vitals reviewed.     Assessment & Plan:   Problem List Items Addressed This Visit    None    Visit Diagnoses    Axillary lymphadenopathy    -  Primary   Right axillary lymph node, history of breast cancer many years ago, has implants   Relevant Medications   doxycycline (VIBRA-TABS) 100 MG tablet   History of breast cancer          Right axillary lymph node that is large and palpable and mobile, will give  antibiotic but will have a short leash that if it is not improved in 2 weeks that we will send her back to her breast surgeon that did the double mastectomy for history of breast cancer  Follow up plan: Return if symptoms worsen or fail to improve.  Counseling provided for all of the vaccine components No orders of the defined types were placed in this encounter.   Caryl Pina, MD [facility] 08/25/2017, 8:29 AM

## 2017-10-23 DIAGNOSIS — Z1379 Encounter for other screening for genetic and chromosomal anomalies: Secondary | ICD-10-CM | POA: Insufficient documentation

## 2018-04-24 ENCOUNTER — Encounter: Payer: Self-pay | Admitting: Family Medicine

## 2018-04-24 ENCOUNTER — Ambulatory Visit: Payer: BC Managed Care – PPO | Admitting: Family Medicine

## 2018-04-24 VITALS — BP 135/87 | HR 76 | Temp 98.0°F | Ht 63.0 in | Wt 175.0 lb

## 2018-04-24 DIAGNOSIS — Z0000 Encounter for general adult medical examination without abnormal findings: Secondary | ICD-10-CM | POA: Diagnosis not present

## 2018-04-24 DIAGNOSIS — R400 Somnolence: Secondary | ICD-10-CM

## 2018-04-24 DIAGNOSIS — R0683 Snoring: Secondary | ICD-10-CM

## 2018-04-24 NOTE — Progress Notes (Signed)
BP 135/87   Pulse 76   Temp 98 F (36.7 C) (Oral)   Ht _0  (1.6 m)   Wt 175 lb (79.4 kg)   BMI 31.00 kg/m    Subjective:    Patient ID: Erica Murphy, female    DOB: November 15, 1975, 42 y.o.   MRN: [id number]  HPI: Erica Murphy is a 42 y.o. female presenting on 04/24/2018 for Check up/lab work (patient is fasting)   HPI Adult well exam and labs Patient denies any chest pain, shortness of breath, headaches or vision issues, abdominal complaints, diarrhea, nausea, vomiting, or joint issues. She has been having fatigue and snoring and difficulty with energy.  She says that this is been bothering her more but it has been going on for quite some time, we did somewhat of a work-up last year but she would like to do more of a work-up this year.  She is agreeable towards referral to her sleep study. Patient denies any chest pain, shortness of breath, headaches or vision issues, abdominal complaints, diarrhea, nausea, vomiting, or joint issues.   Relevant past medical, surgical, family and social history reviewed and updated as indicated. Interim medical history since our last visit reviewed. Allergies and medications reviewed and updated.  Review of Systems  Constitutional: Negative for chills and fever.  HENT: Negative for ear pain and tinnitus.   Eyes: Negative for pain.  Respiratory: Negative for cough, shortness of breath and wheezing.   Cardiovascular: Negative for chest pain, palpitations and leg swelling.  Gastrointestinal: Negative for abdominal pain, blood in stool, constipation and diarrhea.  Genitourinary: Negative for dysuria and hematuria.  Musculoskeletal: Negative for back pain and myalgias.  Skin: Negative for rash.  Neurological: Negative for dizziness, weakness and headaches.  Psychiatric/Behavioral: Negative for suicidal ideas.    Per HPI unless specifically indicated above   Allergies as of 04/24/2018   No Known Allergies     Medication List        Accurate as of 04/24/18 10:04 AM. Always use your most recent med list.          cetirizine 10 MG tablet Commonly known as:  ZYRTEC Take 1 tablet (10 mg total) by mouth daily.   omeprazole 20 MG tablet Commonly known as:  PRILOSEC OTC Take 20 mg by mouth daily.          Objective:    There were no vitals taken for this visit.  Wt Readings from Last 3 Encounters:  08/25/17 175 lb (79.4 kg)  06/13/17 174 lb (78.9 kg)  04/29/16 175 lb 12.8 oz (79.7 kg)    Physical Exam  Constitutional: She is oriented to person, place, and time. She appears well-developed and well-nourished. No distress.  Eyes: Pupils are equal, round, and reactive to light. Conjunctivae and EOM are normal.  Neck: Neck supple. No thyromegaly present.  Cardiovascular: Normal rate, regular rhythm, normal heart sounds and intact distal pulses.  No murmur heard. Pulmonary/Chest: Effort normal and breath sounds normal. No respiratory distress. She has no wheezes.  Musculoskeletal: Normal range of motion. She exhibits no edema or tenderness.  Lymphadenopathy:    She has no cervical adenopathy.  Neurological: She is alert and oriented to person, place, and time. Coordination normal.  Skin: Skin is warm and dry. No rash noted. She is not diaphoretic.  Psychiatric: She has a normal mood and affect. Her behavior is normal.  Nursing note and vitals reviewed.       Assessment & Plan:  Problem List Items Addressed This Visit    None    Visit Diagnoses    Well adult exam    -  Primary   Relevant Orders   CBC with Differential/Platelet   CMP14+EGFR   Lipid panel   TSH   Ambulatory referral to Sleep Studies   VITAMIN D 25 Hydroxy (Vit-D Deficiency, Fractures)   Snoring       Relevant Orders   CBC with Differential/Platelet   CMP14+EGFR   Lipid panel   TSH   Ambulatory referral to Sleep Studies   VITAMIN D 25 Hydroxy (Vit-D Deficiency, Fractures)   Daytime somnolence       Relevant Orders   CBC with  Differential/Platelet   CMP14+EGFR   Lipid panel   TSH   Ambulatory referral to Sleep Studies   VITAMIN D 25 Hydroxy (Vit-D Deficiency, Fractures)       Follow up plan: Return in about 1 year (around 04/25/2019), or if symptoms worsen or fail to improve.  Counseling provided for all of the vaccine components Orders Placed This Encounter  Procedures  . CBC with Differential/Platelet  . CMP14+EGFR  . Lipid panel  . TSH  . VITAMIN D 25 Hydroxy (Vit-D Deficiency, Fractures)  . Ambulatory referral to Sleep Studies    Caryl Pina, MD [facility] 04/24/2018, 10:04 AM

## 2018-04-25 LAB — CBC WITH DIFFERENTIAL/PLATELET
BASOS: 1 %
Basophils Absolute: 0.1 10*3/uL (ref 0.0–0.2)
EOS (ABSOLUTE): 0.3 10*3/uL (ref 0.0–0.4)
Eos: 5 %
Hematocrit: 40.1 % (ref 34.0–46.6)
Hemoglobin: 13.6 g/dL (ref 11.1–15.9)
Immature Grans (Abs): 0 10*3/uL (ref 0.0–0.1)
Immature Granulocytes: 0 %
LYMPHS ABS: 2.2 10*3/uL (ref 0.7–3.1)
Lymphs: 41 %
MCH: 28.9 pg (ref 26.6–33.0)
MCHC: 33.9 g/dL (ref 31.5–35.7)
MCV: 85 fL (ref 79–97)
MONOS ABS: 0.5 10*3/uL (ref 0.1–0.9)
Monocytes: 10 %
NEUTROS ABS: 2.3 10*3/uL (ref 1.4–7.0)
Neutrophils: 43 %
PLATELETS: 295 10*3/uL (ref 150–450)
RBC: 4.7 x10E6/uL (ref 3.77–5.28)
RDW: 13 % (ref 12.3–15.4)
WBC: 5.4 10*3/uL (ref 3.4–10.8)

## 2018-04-25 LAB — CMP14+EGFR
A/G RATIO: 2 (ref 1.2–2.2)
ALT: 20 IU/L (ref 0–32)
AST: 15 IU/L (ref 0–40)
Albumin: 4.7 g/dL (ref 3.5–5.5)
Alkaline Phosphatase: 73 IU/L (ref 39–117)
BILIRUBIN TOTAL: 0.4 mg/dL (ref 0.0–1.2)
BUN/Creatinine Ratio: 18 (ref 9–23)
BUN: 15 mg/dL (ref 6–24)
CHLORIDE: 106 mmol/L (ref 96–106)
CO2: 25 mmol/L (ref 20–29)
Calcium: 9.8 mg/dL (ref 8.7–10.2)
Creatinine, Ser: 0.84 mg/dL (ref 0.57–1.00)
GFR calc Af Amer: 98 mL/min/{1.73_m2} (ref >59)
GFR calc non Af Amer: 85 mL/min/{1.73_m2} (ref >59)
Globulin, Total: 2.4 g/dL (ref 1.5–4.5)
Glucose: 91 mg/dL (ref 65–99)
POTASSIUM: 4.5 mmol/L (ref 3.5–5.2)
SODIUM: 144 mmol/L (ref 134–144)
Total Protein: 7.1 g/dL (ref 6.0–8.5)

## 2018-04-25 LAB — LIPID PANEL
Chol/HDL Ratio: 3.6 ratio (ref 0.0–4.4)
Cholesterol, Total: 197 mg/dL (ref 100–199)
HDL: 55 mg/dL (ref >39)
LDL Calculated: 121 mg/dL — ABNORMAL HIGH (ref 0–99)
TRIGLYCERIDES: 104 mg/dL (ref 0–149)
VLDL Cholesterol Cal: 21 mg/dL (ref 5–40)

## 2018-04-25 LAB — VITAMIN D 25 HYDROXY (VIT D DEFICIENCY, FRACTURES): Vit D, 25-Hydroxy: 25 ng/mL — ABNORMAL LOW (ref 30.0–100.0)

## 2018-04-25 LAB — TSH: TSH: 1.93 u[IU]/mL (ref 0.450–4.500)

## 2018-05-13 ENCOUNTER — Encounter: Payer: Self-pay | Admitting: Family Medicine

## 2018-06-29 ENCOUNTER — Institutional Professional Consult (permissible substitution): Payer: BC Managed Care – PPO | Admitting: Neurology

## 2018-07-21 ENCOUNTER — Encounter: Payer: Self-pay | Admitting: Family Medicine

## 2018-07-21 ENCOUNTER — Ambulatory Visit: Payer: BC Managed Care – PPO | Admitting: Family Medicine

## 2018-07-21 ENCOUNTER — Ambulatory Visit (INDEPENDENT_AMBULATORY_CARE_PROVIDER_SITE_OTHER): Payer: BC Managed Care – PPO

## 2018-07-21 VITALS — BP 130/82 | HR 72 | Temp 97.5°F | Ht 63.0 in | Wt 179.0 lb

## 2018-07-21 DIAGNOSIS — R350 Frequency of micturition: Secondary | ICD-10-CM | POA: Diagnosis not present

## 2018-07-21 DIAGNOSIS — R3129 Other microscopic hematuria: Secondary | ICD-10-CM | POA: Diagnosis not present

## 2018-07-21 DIAGNOSIS — R109 Unspecified abdominal pain: Secondary | ICD-10-CM

## 2018-07-21 DIAGNOSIS — K5900 Constipation, unspecified: Secondary | ICD-10-CM

## 2018-07-21 LAB — MICROSCOPIC EXAMINATION: Renal Epithel, UA: NONE SEEN /hpf

## 2018-07-21 LAB — URINALYSIS, COMPLETE
Bilirubin, UA: NEGATIVE
Glucose, UA: NEGATIVE
Ketones, UA: NEGATIVE
LEUKOCYTES UA: NEGATIVE
NITRITE UA: NEGATIVE
Specific Gravity, UA: 1.025 (ref 1.005–1.030)
Urobilinogen, Ur: 0.2 mg/dL (ref 0.2–1.0)
pH, UA: 6 (ref 5.0–7.5)

## 2018-07-21 MED ORDER — POLYETHYLENE GLYCOL 3350 17 GM/SCOOP PO POWD: 17.0000 g | Freq: Two times a day (BID) | ORAL | 1 refills | Status: DC | PRN

## 2018-07-21 NOTE — Progress Notes (Signed)
Subjective:    Patient ID: Erica Murphy, female    DOB: June 10, 1976, 42 y.o.   MRN: [id number]  Chief Complaint:  Urinary Tract Infection (right sided flank/abdominal pain, urinary hesitancy)   HPI: Erica Murphy is a 42 y.o. female presenting on 07/21/2018 for Urinary Tract Infection (right sided flank/abdominal pain, urinary hesitancy)  Pt presents today with complaints of right flank pain, onset Monday. She reports the pain is intermittent and aching/cramping in nature, 6/10 at worst. She denies radiation of the pain into her groin. She does have urinary symptoms with the pain. She reports urgency, frequency, and pressure in her lower abdomen. Denies hematuria or dysuria. She reports chills, but states she did not have a fever when measured at home. Pt denies any vaginal symptoms. She reports nausea, denies vomiting. She has taken ibuprofen at home with relief of the discomfort. Denies history of kidney stones.  Pt reports she has had a decrease in the number and amount of stools over the last 2 weeks. States she has a bowel movement daily, but it is a small amount. Denies pain with bowel movements, denies blood in stools.   Relevant past medical, surgical, family and social history reviewed and updated as indicated. Interim medical history since our last visit reviewed. Allergies and medications reviewed and updated. DATA REVIEWED: CHART IN EPIC  Family History reviewed for pertinent findings.  Past Medical History:  Diagnosis Date  . Allergy     Past Surgical History:  Procedure Laterality Date  . BREAST SURGERY Bilateral    Masectomy  . CESAREAN SECTION      Social History   Socioeconomic History  . Marital status: Married    Spouse name: Not on file  . Number of children: Not on file  . Years of education: Not on file  . Highest education level: Not on file  Occupational History  . Not on file  Social Needs  . Financial resource strain: Not on file  . Food  insecurity:    Worry: Not on file    Inability: Not on file  . Transportation needs:    Medical: Not on file    Non-medical: Not on file  Tobacco Use  . Smoking status: Never Smoker  . Smokeless tobacco: Never Used  Substance and Sexual Activity  . Alcohol use: No  . Drug use: No  . Sexual activity: Not on file  Lifestyle  . Physical activity:    Days per week: Not on file    Minutes per session: Not on file  . Stress: Not on file  Relationships  . Social connections:    Talks on phone: Not on file    Gets together: Not on file    Attends religious service: Not on file    Active member of club or organization: Not on file    Attends meetings of clubs or organizations: Not on file    Relationship status: Not on file  . Intimate partner violence:    Fear of current or ex partner: Not on file    Emotionally abused: Not on file    Physically abused: Not on file    Forced sexual activity: Not on file  Other Topics Concern  . Not on file  Social History Narrative  . Not on file    Outpatient Encounter Medications as of 07/21/2018  Medication Sig  . cetirizine (ZYRTEC) 10 MG tablet Take 1 tablet (10 mg total) by mouth daily.  Marland Kitchen omeprazole (PRILOSEC  OTC) 20 MG tablet Take 20 mg by mouth daily.  . polyethylene glycol powder (GLYCOLAX/MIRALAX) powder Take 17 g by mouth 2 (two) times daily as needed.   No facility-administered encounter medications on file as of 07/21/2018.     No Known Allergies  Review of Systems  Constitutional: Positive for chills. Negative for fatigue and fever.  Gastrointestinal: Positive for abdominal pain, constipation (Decreased number of stools and decreased amount of stool over the last 2 weeks) and nausea. Negative for abdominal distention, anal bleeding, blood in stool, diarrhea, rectal pain and vomiting.  Genitourinary: Positive for flank pain, frequency and urgency. Negative for decreased urine volume, difficulty urinating, dyspareunia, hematuria,  menstrual problem, pelvic pain, vaginal bleeding, vaginal discharge and vaginal pain.  All other systems reviewed and are negative.       Objective:    BP 130/82   Pulse 72   Temp (!) 97.5 F (36.4 C) (Oral)   Ht 5\' 3"  (1.6 m)   Wt 179 lb (81.2 kg)   BMI 31.71 kg/m    Wt Readings from Last 3 Encounters:  07/21/18 179 lb (81.2 kg)  04/24/18 175 lb (79.4 kg)  08/25/17 175 lb (79.4 kg)    Physical Exam  Constitutional: She is oriented to person, place, and time. She appears well-developed and well-nourished. No distress.  HENT:  Head: Normocephalic.  Cardiovascular: Normal rate, regular rhythm and normal heart sounds.  Pulmonary/Chest: Effort normal and breath sounds normal.  Abdominal: Soft. Normal appearance and bowel sounds are normal. She exhibits no distension, no fluid wave and no mass. There is no hepatosplenomegaly. There is tenderness in the right lower quadrant and suprapubic area. There is no rigidity, no rebound, no guarding, no CVA tenderness, no tenderness at McBurney's point and negative Murphy's sign. No hernia.  Neurological: She is alert and oriented to person, place, and time.  Skin: Skin is warm and dry. Capillary refill takes less than 2 seconds.  Psychiatric: She has a normal mood and affect. Her behavior is normal. Judgment and thought content normal.  Nursing note and vitals reviewed.   Urinalysis: Positive for blood, few bacteria. Read and interpreted by Monia Pouch, FNP-C, [facility].   X:Ray Abdomen: Constipation. Preliminary x-ray reading by Monia Pouch, FNP-C, [facility]. Discussed hematuria with Dr. Warrick Parisian, he agreed with culturing the urine prior to additional treatments or studies.   Assessment & Plan:   1. Urinary frequency - Urinalysis, Complete - Urine Culture  2. Acute right flank pain Preliminary xray reading negative for kidney stones, positive for constipation. - DG Abd 1 View; Future - Urinalysis, Complete  3. Other microscopic  hematuria Increase water intake. Urinalysis positive for blood, but only few bacteria, will culture and treat if warranted. Follow-up in 4 weeks for repeat urinalysis.  - DG Abd 1 View; Future - Urine Culture  4. Constipation in female Miralax clean out, instructions provided.  Increase water and fiber intake. Increase exercise. Notify office or go to the emergency department if you develop severe pain, fever, intractable vomiting.  Preliminary xray reading negative for kidney stones, positive for constipation. Pt will be notified of any additional xray findings.  - polyethylene glycol powder (GLYCOLAX/MIRALAX) powder; Take 17 g by mouth 2 (two) times daily as needed.  Dispense: 3350 g; Refill: 1   Continue all other maintenance medications.  Follow up plan: Return in about 4 weeks (around 08/18/2018), or if symptoms worsen or fail to improve, for Recheck urinalysis .  Educational handout given for Constipation, hematuria  The above assessment and management plan was discussed with the patient. The patient verbalized understanding of and has agreed to the management plan. Patient is aware to call the clinic if symptoms persist or worsen. Patient is aware when to return to the clinic for a follow-up visit. Patient educated on when it is appropriate to go to the emergency department.   Michelle Tatum Corl, FNP-C [facility] [phone]  

## 2018-07-21 NOTE — Patient Instructions (Signed)
Thank you for coming in to clinic today.  1. Your xray is consistent with Constipation, likely cause of your General Abdominal Pain / Cramping. 2. Start with Miralax, prescription was sent to pharmacy. First dose 68g (4 capfuls) in 32oz water over 1 to 2 hours for clean out. Next day start 17g or 1 capful daily, may adjust dose up or down by half a capful every few days. Recommend to take this medicine daily for next 1-2 weeks, you may need to use it longer if needed. - Goal is to have soft regular bowel movement 1-3x daily, if too runny or diarrhea, then reduce dose of the medicine to every other day.  Improve water intake, hydration will help Also recommend increased vegetables, fruits, fiber intake Can try daily Metamucil or Fiber supplement at pharmacy over the counter  Follow-up if symptoms are not improving with bowel movements, or if pain worsens, develop fevers, nausea, vomiting.  Please schedule a follow-up appointment with Monia Pouch, FNP, in 1 month to follow-up Constipation  If you have any other questions or concerns, please feel free to call the clinic to contact me. You may also schedule an earlier appointment if necessary.  However, if your symptoms get significantly worse, please go to the Emergency Department to seek immediate medical attention.   Constipation, Adult Constipation is when a person:  Poops (has a bowel movement) fewer times in a week than normal.  Has a hard time pooping.  Has poop that is dry, hard, or bigger than normal.  Follow these instructions at home: Eating and drinking   Eat foods that have a lot of fiber, such as: ? Fresh fruits and vegetables. ? Whole grains. ? Beans.  Eat less of foods that are high in fat, low in fiber, or overly processed, such as: ? [place] fries. ? Hamburgers. ? Cookies. ? Candy. ? Soda.  Drink enough fluid to keep your pee (urine) clear or pale yellow. General instructions  Exercise regularly or as  told by your doctor.  Go to the restroom when you feel like you need to poop. Do not hold it in.  Take over-the-counter and prescription medicines only as told by your doctor. These include any fiber supplements.  Do pelvic floor retraining exercises, such as: ? Doing deep breathing while relaxing your lower belly (abdomen). ? Relaxing your pelvic floor while pooping.  Watch your condition for any changes.  Keep all follow-up visits as told by your doctor. This is important. Contact a doctor if:  You have pain that gets worse.  You have a fever.  You have not pooped for 4 days.  You throw up (vomit).  You are not hungry.  You lose weight.  You are bleeding from the anus.  You have thin, pencil-like poop (stool). Get help right away if:  You have a fever, and your symptoms suddenly get worse.  You leak poop or have blood in your poop.  Your belly feels hard or bigger than normal (is bloated).  You have very bad belly pain.  You feel dizzy or you faint. This information is not intended to replace advice given to you by your health care provider. Make sure you discuss any questions you have with your health care provider. Document Released: 04/08/2008 Document Revised: 05/10/2016 Document Reviewed: 04/10/2016 Elsevier Interactive Patient Education  2018 [employer].  Hematuria, Adult Hematuria is blood in your urine. It can be caused by a bladder infection, kidney infection, prostate infection, kidney stone, or  cancer of your urinary tract. Infections can usually be treated with medicine, and a kidney stone usually will pass through your urine. If neither of these is the cause of your hematuria, further workup to find out the reason may be needed. It is very important that you tell your health care provider about any blood you see in your urine, even if the blood stops without treatment or happens without causing pain. Blood in your urine that happens and then stops and  then happens again can be a symptom of a very serious condition. Also, pain is not a symptom in the initial stages of many urinary cancers. Follow these instructions at home:  Drink lots of fluid, 3-4 quarts a day. If you have been diagnosed with an infection, cranberry juice is especially recommended, in addition to large amounts of water.  Avoid caffeine, tea, and carbonated beverages because they tend to irritate the bladder.  Avoid alcohol because it may irritate the prostate.  Take all medicines as directed by your health care provider.  If you were prescribed an antibiotic medicine, finish it all even if you start to feel better.  If you have been diagnosed with a kidney stone, follow your health care provider's instructions regarding straining your urine to catch the stone.  Empty your bladder often. Avoid holding urine for long periods of time.  After a bowel movement, women should cleanse front to back. Use each tissue only once.  Empty your bladder before and after sexual intercourse if you are a female. Contact a health care provider if:  You develop back pain.  You have a fever.  You have a feeling of sickness in your stomach (nausea) or vomiting.  Your symptoms are not better in 3 days. Return sooner if you are getting worse. Get help right away if:  You develop severe vomiting and are unable to keep the medicine down.  You develop severe back or abdominal pain despite taking your medicines.  You begin passing a large amount of blood or clots in your urine.  You feel extremely weak or faint, or you pass out. This information is not intended to replace advice given to you by your health care provider. Make sure you discuss any questions you have with your health care provider. Document Released: 10/21/2005 Document Revised: 03/28/2016 Document Reviewed: 06/21/2013 Elsevier Interactive Patient Education  2017 [employer].

## 2018-07-21 NOTE — Progress Notes (Signed)
Pt aware of results during appointment.

## 2018-07-22 LAB — URINE CULTURE

## 2018-08-18 ENCOUNTER — Encounter: Payer: Self-pay | Admitting: Family Medicine

## 2018-08-18 ENCOUNTER — Ambulatory Visit: Payer: BC Managed Care – PPO | Admitting: Family Medicine

## 2018-08-18 VITALS — BP 118/75 | HR 73 | Temp 98.1°F | Ht 63.0 in | Wt 175.0 lb

## 2018-08-18 DIAGNOSIS — K5900 Constipation, unspecified: Secondary | ICD-10-CM

## 2018-08-18 DIAGNOSIS — Z87898 Personal history of other specified conditions: Secondary | ICD-10-CM

## 2018-08-18 LAB — MICROSCOPIC EXAMINATION
RBC MICROSCOPIC, UA: NONE SEEN /HPF (ref 0–2)
Renal Epithel, UA: NONE SEEN /hpf

## 2018-08-18 LAB — URINALYSIS, COMPLETE
Bilirubin, UA: NEGATIVE
GLUCOSE, UA: NEGATIVE
Ketones, UA: NEGATIVE
LEUKOCYTES UA: NEGATIVE
NITRITE UA: NEGATIVE
PH UA: 7 (ref 5.0–7.5)
Protein, UA: NEGATIVE
RBC, UA: NEGATIVE
Specific Gravity, UA: 1.01 (ref 1.005–1.030)
UUROB: 0.2 mg/dL (ref 0.2–1.0)

## 2018-08-18 NOTE — Patient Instructions (Signed)
High-Fiber Diet  Fiber, also called dietary fiber, is a type of carbohydrate found in fruits, vegetables, whole grains, and beans. A high-fiber diet can have many health benefits. Your health care provider may recommend a high-fiber diet to help:  · Prevent constipation. Fiber can make your bowel movements more regular.  · Lower your cholesterol.  · Relieve hemorrhoids, uncomplicated diverticulosis, or irritable bowel syndrome.  · Prevent overeating as part of a weight-loss plan.  · Prevent heart disease, type 2 diabetes, and certain cancers.    What is my plan?  The recommended daily intake of fiber includes:  · 38 grams for men under age 50.  · 30 grams for men over age 50.  · 25 grams for women under age 50.  · 21 grams for women over age 50.    You can get the recommended daily intake of dietary fiber by eating a variety of fruits, vegetables, grains, and beans. Your health care provider may also recommend a fiber supplement if it is not possible to get enough fiber through your diet.  What do I need to know about a high-fiber diet?  · Fiber supplements have not been widely studied for their effectiveness, so it is better to get fiber through food sources.  · Always check the fiber content on the nutrition facts label of any prepackaged food. Look for foods that contain at least 5 grams of fiber per serving.  · Ask your dietitian if you have questions about specific foods that are related to your condition, especially if those foods are not listed in the following section.  · Increase your daily fiber consumption gradually. Increasing your intake of dietary fiber too quickly may cause bloating, cramping, or gas.  · Drink plenty of water. Water helps you to digest fiber.  What foods can I eat?  Grains  Whole-grain breads. Multigrain cereal. Oats and oatmeal. Brown rice. Barley. Bulgur wheat. Millet. Bran muffins. Popcorn. Rye wafer crackers.  Vegetables   Sweet potatoes. Spinach. Kale. Artichokes. Cabbage. Broccoli. Green peas. Carrots. Squash.  Fruits  Berries. Pears. Apples. Oranges. Avocados. Prunes and raisins. Dried figs.  Meats and Other Protein Sources  [place]y, kidney, pinto, and soy beans. Split peas. Lentils. Nuts and seeds.  Dairy  Fiber-fortified yogurt.  Beverages  Fiber-fortified soy milk. Fiber-fortified orange juice.  Other  Fiber bars.  The items listed above may not be a complete list of recommended foods or beverages. Contact your dietitian for more options.  What foods are not recommended?  Grains  White bread. Pasta made with refined flour. White rice.  Vegetables  Fried potatoes. Canned vegetables. Well-cooked vegetables.  Fruits  Fruit juice. Cooked, strained fruit.  Meats and Other Protein Sources  Fatty cuts of meat. Fried poultry or fried fish.  Dairy  Milk. Yogurt. Cream cheese. Sour cream.  Beverages  Soft drinks.  Other  Cakes and pastries. Butter and oils.  The items listed above may not be a complete list of foods and beverages to avoid. Contact your dietitian for more information.  What are some tips for including high-fiber foods in my diet?  · Eat a wide variety of high-fiber foods.  · Make sure that half of all grains consumed each day are whole grains.  · Replace breads and cereals made from refined flour or white flour with whole-grain breads and cereals.  · Replace white rice with brown rice, bulgur wheat, or millet.  · Start the day with a breakfast that is high in fiber,   such as a cereal that contains at least 5 grams of fiber per serving.  · Use beans in place of meat in soups, salads, or pasta.  · Eat high-fiber snacks, such as berries, raw vegetables, nuts, or popcorn.  This information is not intended to replace advice given to you by your health care provider. Make sure you discuss any questions you have with your health care provider.  Document Released: 10/21/2005 Document Revised: 03/28/2016 Document Reviewed: 04/05/2014   Elsevier Interactive Patient Education © 2018 [employer]

## 2018-08-18 NOTE — Progress Notes (Signed)
Subjective:    Patient ID: Erica Murphy, female    DOB: [birth year], 42 y.o.   MRN: [id number]  Chief Complaint:  No chief complaint on file.   HPI: Erica Murphy is a 42 y.o. female presenting on 08/18/2018 for No chief complaint on file.  Pt presents today for follow up for hematuria and constipation. Pt states she has been having more frequent bowel movements. States she is titrating the Miralax to keep her stools formed but soft. States she is tolerating the Miralax well. Denies nausea, vomiting, abdominal pain or distention, or blood in stool. She reports she no longer has dysuria, states she is still going frequently. Denies hematuria, fever, chills, or flank pain.   Relevant past medical, surgical, family, and social history reviewed and updated as indicated.  Allergies and medications reviewed and updated.   Past Medical History:  Diagnosis Date  . Allergy     Past Surgical History:  Procedure Laterality Date  . BREAST SURGERY Bilateral    Masectomy  . CESAREAN SECTION      Social History   Socioeconomic History  . Marital status: Married    Spouse name: Not on file  . Number of children: Not on file  . Years of education: Not on file  . Highest education level: Not on file  Occupational History  . Not on file  Social Needs  . Financial resource strain: Not on file  . Food insecurity:    Worry: Not on file    Inability: Not on file  . Transportation needs:    Medical: Not on file    Non-medical: Not on file  Tobacco Use  . Smoking status: Never Smoker  . Smokeless tobacco: Never Used  Substance and Sexual Activity  . Alcohol use: No  . Drug use: No  . Sexual activity: Not on file  Lifestyle  . Physical activity:    Days per week: Not on file    Minutes per session: Not on file  . Stress: Not on file  Relationships  . Social connections:    Talks on phone: Not on file    Gets together: Not on file    Attends religious service: Not on file      Active member of club or organization: Not on file    Attends meetings of clubs or organizations: Not on file    Relationship status: Not on file  . Intimate partner violence:    Fear of current or ex partner: Not on file    Emotionally abused: Not on file    Physically abused: Not on file    Forced sexual activity: Not on file  Other Topics Concern  . Not on file  Social History Narrative  . Not on file    Outpatient Encounter Medications as of 08/18/2018  Medication Sig  . cetirizine (ZYRTEC) 10 MG tablet Take 1 tablet (10 mg total) by mouth daily.  . polyethylene glycol powder (GLYCOLAX/MIRALAX) powder Take 17 g by mouth 2 (two) times daily as needed.  Marland Kitchen omeprazole (PRILOSEC OTC) 20 MG tablet Take 20 mg by mouth daily.   No facility-administered encounter medications on file as of 08/18/2018.     No Known Allergies  Review of Systems  Constitutional: Negative for chills, fatigue and fever.  Respiratory: Negative for shortness of breath.   Cardiovascular: Negative for chest pain and palpitations.  Gastrointestinal: Positive for constipation (intermittent ). Negative for abdominal distention, abdominal pain, anal bleeding, blood in stool,  diarrhea, nausea, rectal pain and vomiting.  Genitourinary: Positive for frequency. Negative for dysuria, flank pain, hematuria and pelvic pain.  Neurological: Negative for weakness.  All other systems reviewed and are negative.       Objective:    BP 118/75   Pulse 73   Temp 98.1 F (36.7 C) (Oral)   Ht 5\' 3"  (1.6 m)   Wt 175 lb (79.4 kg)   BMI 31.00 kg/m    Wt Readings from Last 3 Encounters:  08/18/18 175 lb (79.4 kg)  07/21/18 179 lb (81.2 kg)  04/24/18 175 lb (79.4 kg)    Physical Exam  Constitutional: She is oriented to person, place, and time. She appears well-developed and well-nourished. No distress.  HENT:  Head: Normocephalic.  Cardiovascular: Normal rate, regular rhythm and normal heart sounds. Exam reveals  no gallop and no friction rub.  No murmur heard. Pulmonary/Chest: Effort normal and breath sounds normal. No respiratory distress.  Abdominal: Soft. Bowel sounds are normal. She exhibits no distension. There is no tenderness.  Neurological: She is alert and oriented to person, place, and time.  Skin: Skin is warm and dry. Capillary refill takes less than 2 seconds.  Psychiatric: She has a normal mood and affect. Her behavior is normal. Judgment and thought content normal.  Nursing note and vitals reviewed.   Results for orders placed or performed in visit on 07/21/18  Urine Culture  Result Value Ref Range   Urine Culture, Routine Final report    Organism ID, Bacteria Comment   Microscopic Examination  Result Value Ref Range   WBC, UA 0-5 0 - 5 /hpf   RBC, UA 11-30 (A) 0 - 2 /hpf   Epithelial Cells (non renal) 0-10 0 - 10 /hpf   Renal Epithel, UA None seen None seen /hpf   Bacteria, UA Few None seen/Few  Urinalysis, Complete  Result Value Ref Range   Specific Gravity, UA 1.025 1.005 - 1.030   pH, UA 6.0 5.0 - 7.5   Color, UA Yellow Yellow   Appearance Ur Clear Clear   Leukocytes, UA Negative Negative   Protein, UA Trace (A) Negative/Trace   Glucose, UA Negative Negative   Ketones, UA Negative Negative   RBC, UA 2+ (A) Negative   Bilirubin, UA Negative Negative   Urobilinogen, Ur 0.2 0.2 - 1.0 mg/dL   Nitrite, UA Negative Negative   Microscopic Examination See below:        Pertinent labs & imaging results that were available during my care of the patient were reviewed by me and considered in my medical decision making.  Assessment & Plan:  Diagnoses and all orders for this visit:  History of urinary frequency Urinalysis in office negative for nitrites, leukocytes, and blood. Microscopic exam revealed moderate bacteria and epithelial cells. Pt asymptomatic. Will culture and treat if warranted.  -     Urine Culture -     Urinalysis, Complete  Constipation in  female Increase fiber intake. Continue Miralax as needed. Increase water intake.      Continue all other maintenance medications.  Follow up plan: Return if symptoms worsen or fail to improve.  Educational handout given for high fiber diet  The above assessment and management plan was discussed with the patient. The patient verbalized understanding of and has agreed to the management plan. Patient is aware to call the clinic if symptoms persist or worsen. Patient is aware when to return to the clinic for a follow-up visit. Patient educated on  when it is appropriate to go to the emergency department.   Monia Pouch, FNP-C [facility] [phone]

## 2018-08-19 LAB — URINE CULTURE

## 2019-06-04 IMAGING — US US THYROID
1 series · 14 of 25 positions shown · non-contrast
Comparison: None.

CLINICAL DATA: Thyromegaly on exam

EXAM:
THYROID ULTRASOUND
TECHNIQUE: Ultrasound examination of the thyroid gland and adjacent soft
tissues was performed.

[Series 1: us thyroid · 0.06mm/px · 14 of 43 slices shown]
[im 1/43]
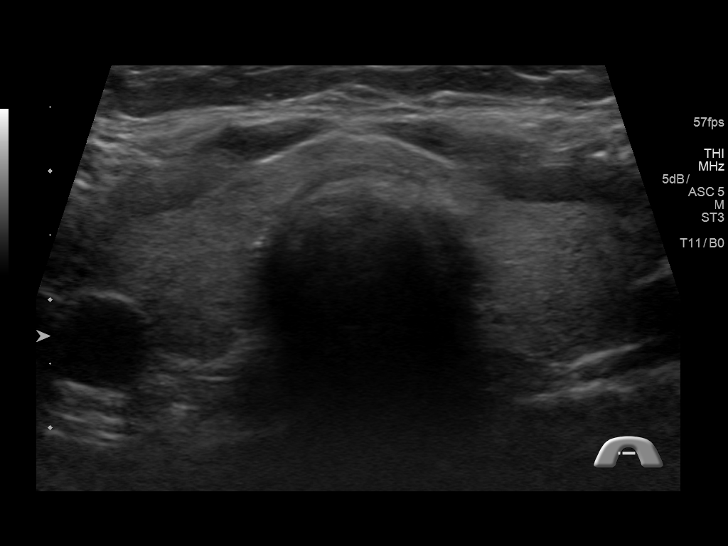
[im 4/43]
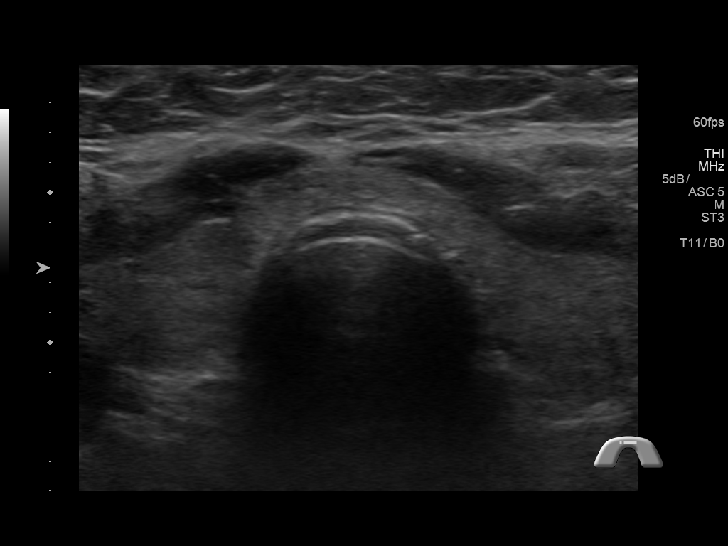
[im 8/43]
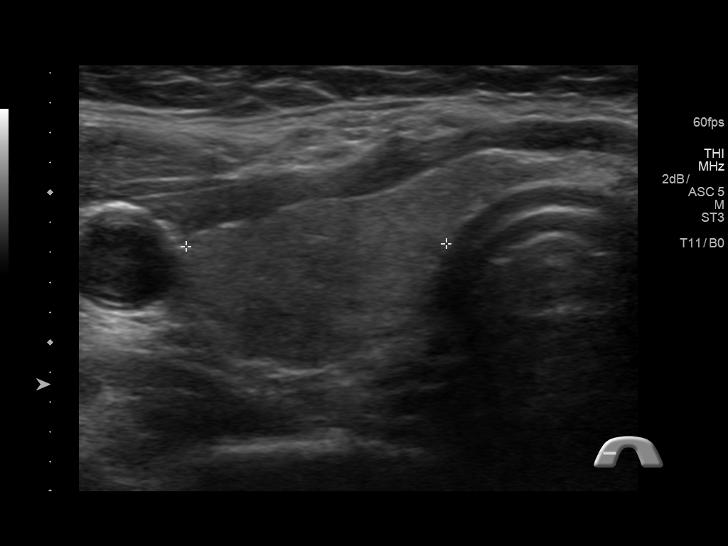
[im 11/43]
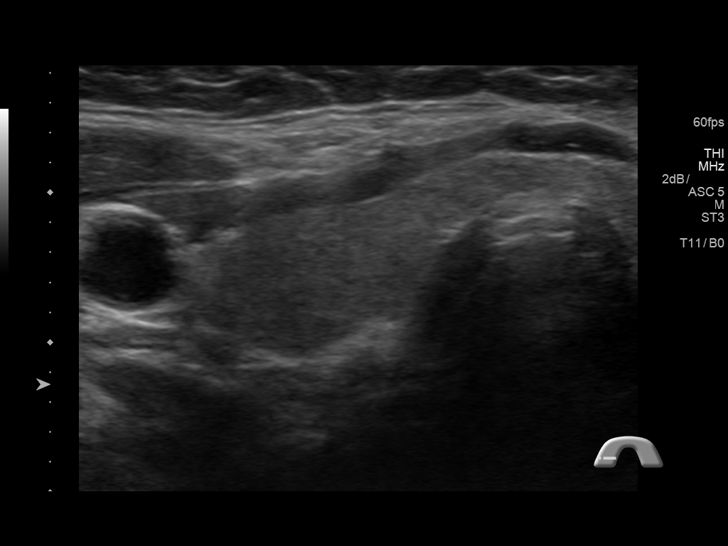
[im 15/43]
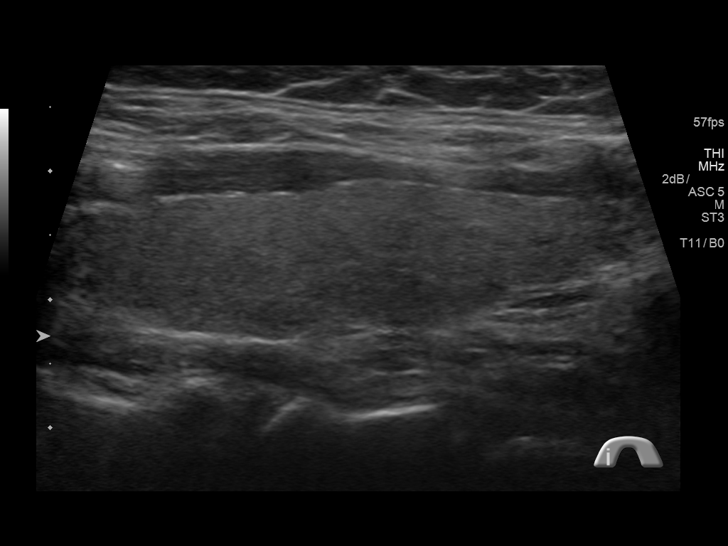
[im 16/43]
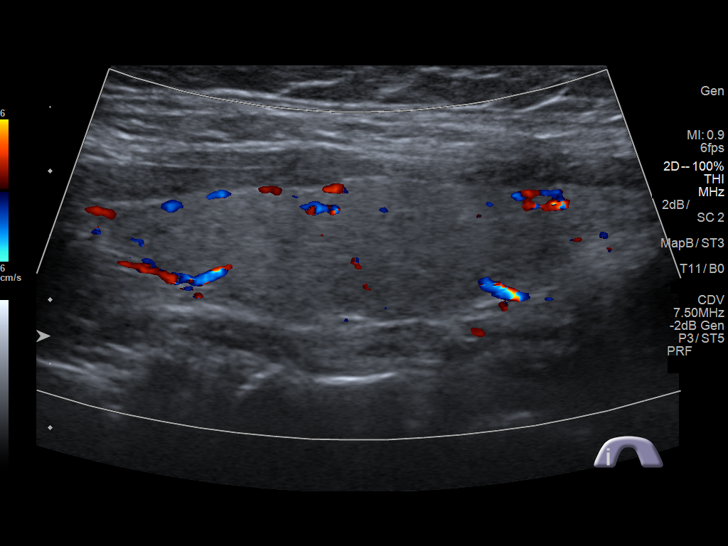
[im 20/43]
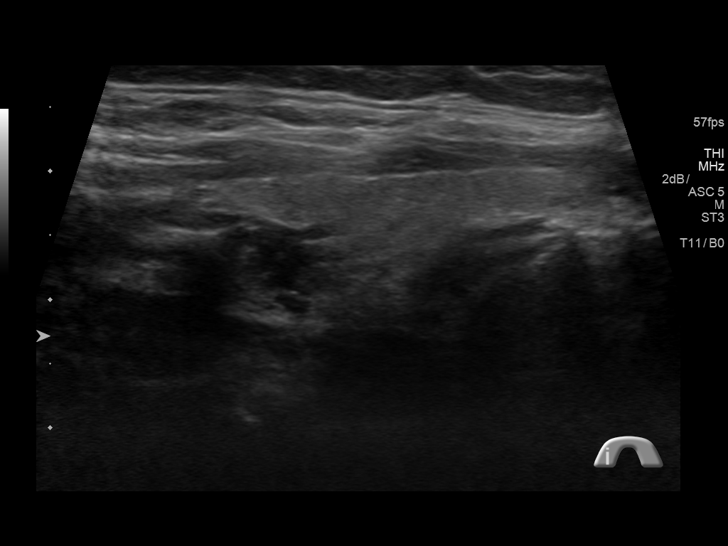
[im 23/43]
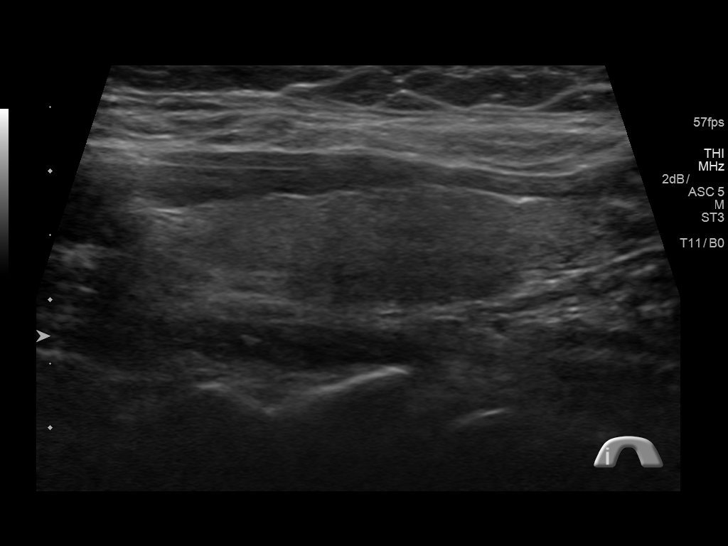
[im 27/43]
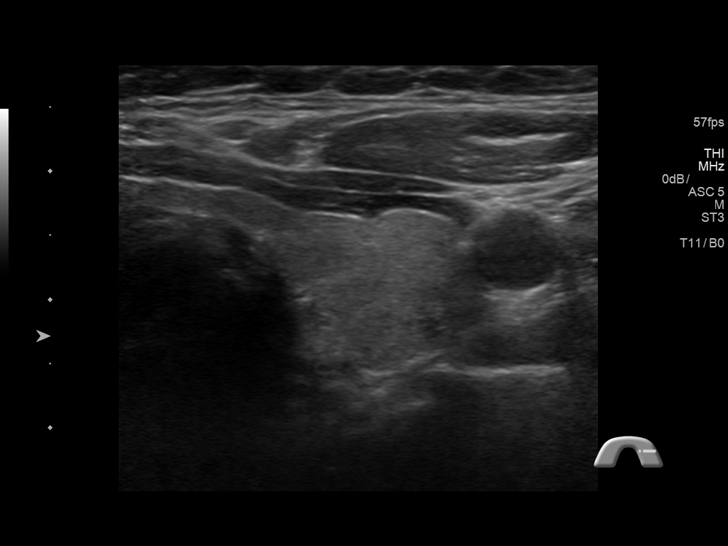
[im 29/43]
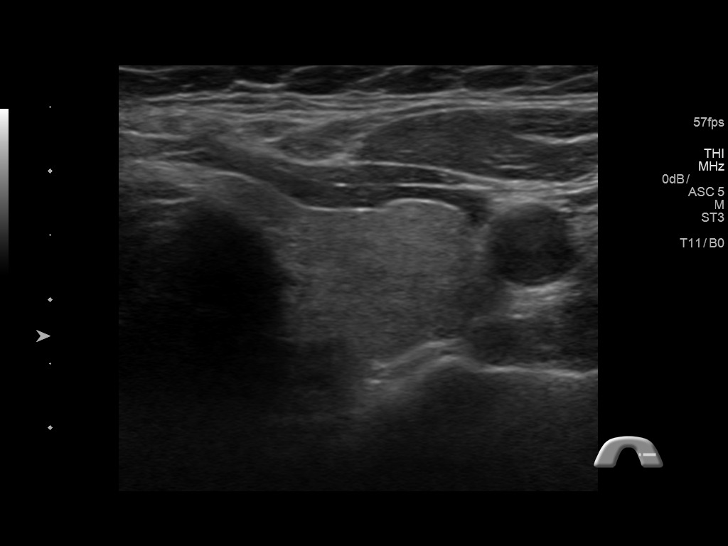
[im 32/43]
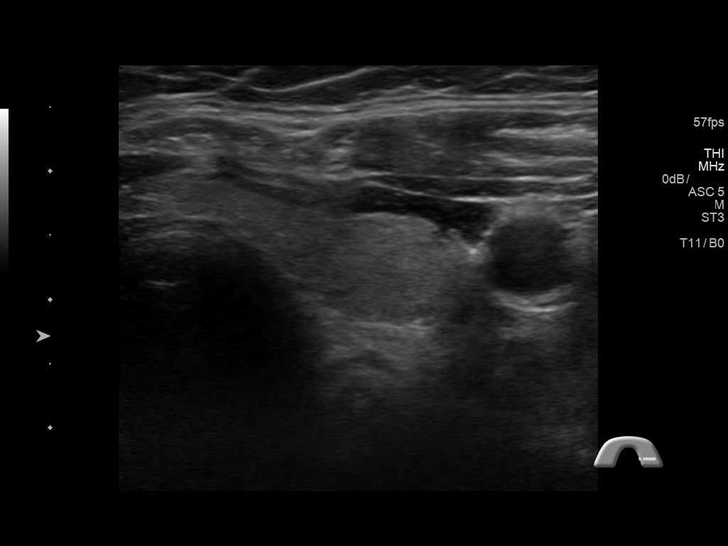
[im 36/43]
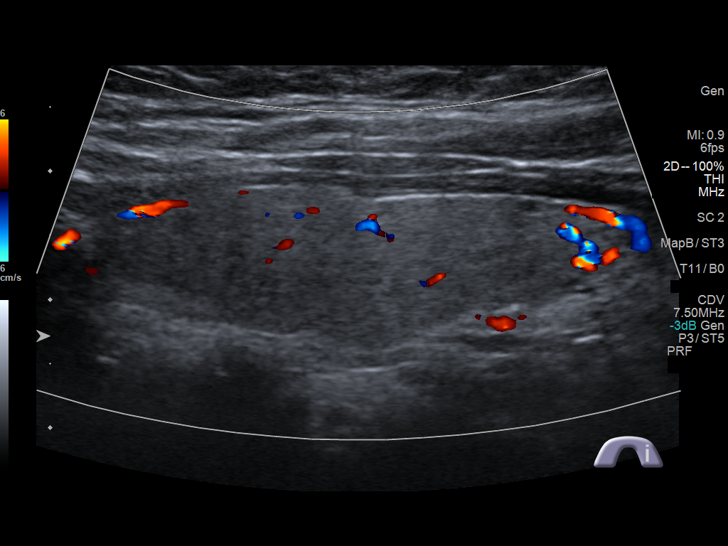
[im 39/43]
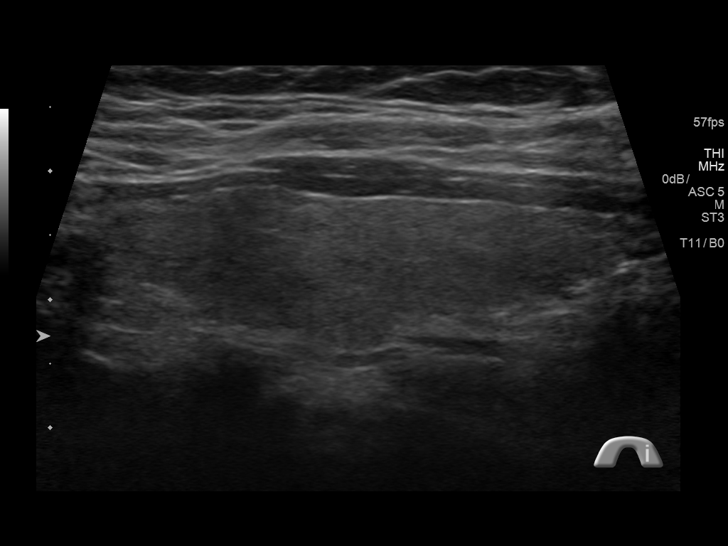
[im 43/43]
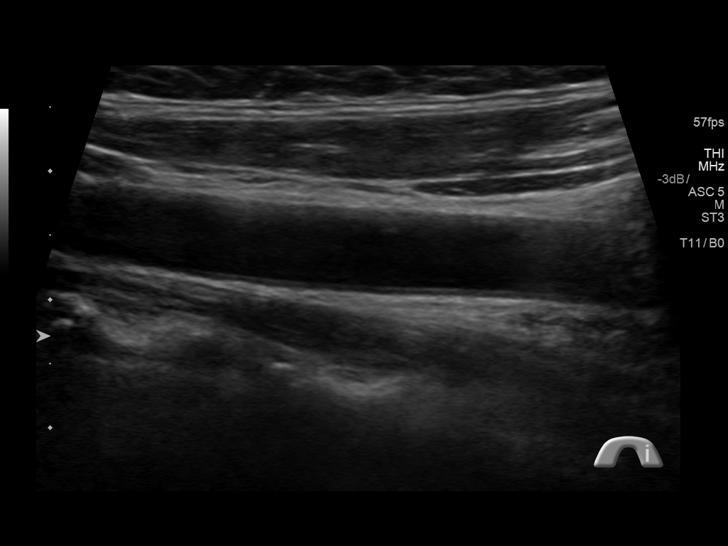

[14 of 25 positions shown; findings below may reference images not displayed]

FINDINGS: Parenchymal Echotexture: Mildly heterogenous

Isthmus: 3 mm

Right lobe: 4.9 x 1.2 x 1.7 cm

Left lobe: 4.4 x 1.3 x 1.6 cm

_________________________________________________________

Estimated total number of nodules >/= 1 cm: 0

Number of spongiform nodules >/=  2 cm not described below (TR1): 0

Number of mixed cystic and solid nodules >/= 1.5 cm not described
below (TR2): 0

_________________________________________________________

Mild gland heterogeneity without focal abnormality or nodule.
IMPRESSION: Nonspecific mild thyroid heterogeneity.  No discrete nodule or mass.

The above is in keeping with the ACR TI-RADS recommendations - [HOSPITAL] 0005;[DATE].

## 2019-12-31 ENCOUNTER — Telehealth: Payer: BC Managed Care – PPO | Admitting: Nurse Practitioner

## 2019-12-31 DIAGNOSIS — J0100 Acute maxillary sinusitis, unspecified: Secondary | ICD-10-CM | POA: Diagnosis not present

## 2019-12-31 MED ORDER — AMOXICILLIN-POT CLAVULANATE 875-125 MG PO TABS: 1.0000 | ORAL_TABLET | Freq: Two times a day (BID) | ORAL | 0 refills | Status: DC

## 2019-12-31 NOTE — Progress Notes (Signed)

## 2020-01-24 ENCOUNTER — Encounter: Payer: Self-pay | Admitting: Family Medicine

## 2020-01-24 ENCOUNTER — Ambulatory Visit: Payer: BC Managed Care – PPO | Admitting: Family Medicine

## 2020-01-24 ENCOUNTER — Other Ambulatory Visit: Payer: Self-pay

## 2020-01-24 ENCOUNTER — Telehealth: Payer: Self-pay | Admitting: Family Medicine

## 2020-01-24 VITALS — BP 126/83 | HR 76 | Temp 99.0°F | Resp 20 | Ht 63.0 in | Wt 171.0 lb

## 2020-01-24 DIAGNOSIS — R030 Elevated blood-pressure reading, without diagnosis of hypertension: Secondary | ICD-10-CM

## 2020-01-24 DIAGNOSIS — Z6830 Body mass index (BMI) 30.0-30.9, adult: Secondary | ICD-10-CM | POA: Diagnosis not present

## 2020-01-24 DIAGNOSIS — N029 Recurrent and persistent hematuria with unspecified morphologic changes: Secondary | ICD-10-CM

## 2020-01-24 DIAGNOSIS — H6122 Impacted cerumen, left ear: Secondary | ICD-10-CM

## 2020-01-24 DIAGNOSIS — E559 Vitamin D deficiency, unspecified: Secondary | ICD-10-CM

## 2020-01-24 DIAGNOSIS — R3129 Other microscopic hematuria: Secondary | ICD-10-CM

## 2020-01-24 LAB — MICROSCOPIC EXAMINATION: Renal Epithel, UA: NONE SEEN /hpf

## 2020-01-24 LAB — URINALYSIS, COMPLETE
Bilirubin, UA: NEGATIVE
Glucose, UA: NEGATIVE
Ketones, UA: NEGATIVE
Leukocytes,UA: NEGATIVE
Nitrite, UA: NEGATIVE
Protein,UA: NEGATIVE
Specific Gravity, UA: >1.03 — ABNORMAL HIGH (ref 1.005–1.030)
Urobilinogen, Ur: 0.2 mg/dL (ref 0.2–1.0)
pH, UA: 5.5 (ref 5.0–7.5)

## 2020-01-24 NOTE — Addendum Note (Signed)
Addended by: Baruch Gouty on: 01/24/2020 11:48 AM   Modules accepted: Orders

## 2020-01-24 NOTE — Progress Notes (Signed)
Subjective:  Patient ID: Erica Murphy, female    DOB: 22-Feb-1976, 44 y.o.   MRN: [id number]  Patient Care Team: Dettinger, Fransisca Kaufmann, MD as PCP - General (Family Medicine)   Chief Complaint:  Medical Management of Chronic Issues   HPI: Erica Murphy is a 44 y.o. female presenting on 01/24/2020 for Medical Management of Chronic Issues  Pt presents today for evaluation of elevated blood pressure reading that was noted at the dentist. Pt states he has never had an issue with elevated blood pressure in the past and was nervous that day at the dental office. Pt denies headaches, chest pain, leg swelling, weakness, or confusion. States she does have fatigue at times. Vit D noted to be low previously and she has not been taking Vit D repletion. Pt states she is having some slight discomfort in her left ear. States this started a few days ago. States the pain comes and goes. No hearing changes or fever. She denies headaches, cough, rhinorrhea, or sore throat.  Relevant past medical, surgical, family, and social history reviewed and updated as indicated.  Allergies and medications reviewed and updated. Date reviewed: Chart in Epic.   Past Medical History:  Diagnosis Date  . Allergy     Past Surgical History:  Procedure Laterality Date  . BREAST SURGERY Bilateral    Masectomy  . CESAREAN SECTION      Social History   Socioeconomic History  . Marital status: Married    Spouse name: Not on file  . Number of children: Not on file  . Years of education: Not on file  . Highest education level: Not on file  Occupational History  . Not on file  Tobacco Use  . Smoking status: Never Smoker  . Smokeless tobacco: Never Used  Substance and Sexual Activity  . Alcohol use: No  . Drug use: No  . Sexual activity: Not on file  Other Topics Concern  . Not on file  Social History Narrative  . Not on file   Social Determinants of Health   Financial Resource Strain:   . Difficulty of  Paying Living Expenses:   Food Insecurity:   . Worried About Charity fundraiser in the Last Year:   . Arboriculturist in the Last Year:   Transportation Needs:   . Film/video editor (Medical):   Marland Kitchen Lack of Transportation (Non-Medical):   Physical Activity:   . Days of Exercise per Week:   . Minutes of Exercise per Session:   Stress:   . Feeling of Stress :   Social Connections:   . Frequency of Communication with Friends and Family:   . Frequency of Social Gatherings with Friends and Family:   . Attends Religious Services:   . Active Member of Clubs or Organizations:   . Attends Archivist Meetings:   Marland Kitchen Marital Status:   Intimate Partner Violence:   . Fear of Current or Ex-Partner:   . Emotionally Abused:   Marland Kitchen Physically Abused:   . Sexually Abused:     Outpatient Encounter Medications as of 01/24/2020  Medication Sig  . cetirizine (ZYRTEC) 10 MG tablet Take 1 tablet (10 mg total) by mouth daily.  . polyethylene glycol powder (GLYCOLAX/MIRALAX) powder Take 17 g by mouth 2 (two) times daily as needed. (Patient not taking: Reported on 01/24/2020)  . [DISCONTINUED] amoxicillin-clavulanate (AUGMENTIN) 875-125 MG tablet Take 1 tablet by mouth 2 (two) times daily.  . [DISCONTINUED] omeprazole (PRILOSEC  OTC) 20 MG tablet Take 20 mg by mouth daily.   No facility-administered encounter medications on file as of 01/24/2020.    No Known Allergies  Review of Systems  Constitutional: Negative for activity change, appetite change, chills, diaphoresis, fatigue, fever and unexpected weight change.  HENT: Positive for ear pain. Negative for congestion, postnasal drip, rhinorrhea, sinus pressure, sinus pain and sore throat.   Eyes: Negative.  Negative for photophobia and visual disturbance.  Respiratory: Negative for cough, chest tightness and shortness of breath.   Cardiovascular: Negative for chest pain, palpitations and leg swelling.  Gastrointestinal: Negative for abdominal  pain, blood in stool, constipation, diarrhea, nausea and vomiting.  Endocrine: Negative.  Negative for cold intolerance, heat intolerance, polydipsia, polyphagia and polyuria.  Genitourinary: Negative for decreased urine volume, difficulty urinating, dysuria, frequency, hematuria and urgency.  Musculoskeletal: Negative for arthralgias and myalgias.  Skin: Negative.   Allergic/Immunologic: Negative.   Neurological: Negative for dizziness, tremors, seizures, syncope, facial asymmetry, speech difficulty, weakness, light-headedness, numbness and headaches.  Hematological: Negative.   Psychiatric/Behavioral: Negative for confusion, hallucinations, sleep disturbance and suicidal ideas.  All other systems reviewed and are negative.       Objective:  BP 126/83   Pulse 76   Temp 99 F (37.2 C)   Resp 20   Ht '5\' 3"'$  (1.6 m)   Wt 171 lb (77.6 kg)   LMP 01/23/2018   SpO2 98%   BMI 30.29 kg/m    Wt Readings from Last 3 Encounters:  01/24/20 171 lb (77.6 kg)  08/18/18 175 lb (79.4 kg)  07/21/18 179 lb (81.2 kg)    Physical Exam Vitals and nursing note reviewed.  Constitutional:      General: She is not in acute distress.    Appearance: Normal appearance. She is well-developed and well-groomed. She is obese. She is not ill-appearing, toxic-appearing or diaphoretic.  HENT:     Head: Normocephalic and atraumatic.     Jaw: There is normal jaw occlusion.     Right Ear: Hearing, tympanic membrane, ear canal and external ear normal.     Left Ear: Hearing and tympanic membrane normal. There is impacted cerumen.     Nose: Nose normal.     Mouth/Throat:     Lips: Pink.     Mouth: Mucous membranes are moist.     Pharynx: Oropharynx is clear. Uvula midline.  Eyes:     General: Lids are normal.     Extraocular Movements: Extraocular movements intact.     Conjunctiva/sclera: Conjunctivae normal.     Pupils: Pupils are equal, round, and reactive to light.  Neck:     Thyroid: No thyroid mass,  thyromegaly or thyroid tenderness.     Vascular: No carotid bruit or JVD.     Trachea: Trachea and phonation normal.  Cardiovascular:     Rate and Rhythm: Normal rate and regular rhythm.     Chest Wall: PMI is not displaced.     Pulses: Normal pulses.     Heart sounds: Normal heart sounds. No murmur. No friction rub. No gallop.   Pulmonary:     Effort: Pulmonary effort is normal. No respiratory distress.     Breath sounds: Normal breath sounds. No wheezing.  Abdominal:     General: Bowel sounds are normal. There is no distension or abdominal bruit.     Palpations: Abdomen is soft. There is no hepatomegaly or splenomegaly.     Tenderness: There is no abdominal tenderness. There is no right  CVA tenderness or left CVA tenderness.     Hernia: No hernia is present.  Musculoskeletal:        General: Normal range of motion.     Cervical back: Normal range of motion and neck supple.     Right lower leg: No edema.     Left lower leg: No edema.  Lymphadenopathy:     Cervical: No cervical adenopathy.  Skin:    General: Skin is warm and dry.     Capillary Refill: Capillary refill takes less than 2 seconds.     Coloration: Skin is not cyanotic, jaundiced or pale.     Findings: No rash.  Neurological:     General: No focal deficit present.     Mental Status: She is alert and oriented to person, place, and time.     Cranial Nerves: Cranial nerves are intact. No cranial nerve deficit.     Sensory: Sensation is intact. No sensory deficit.     Motor: Motor function is intact. No weakness.     Coordination: Coordination is intact. Coordination normal.     Gait: Gait is intact. Gait normal.     Deep Tendon Reflexes: Reflexes are normal and symmetric. Reflexes normal.  Psychiatric:        Attention and Perception: Attention and perception normal.        Mood and Affect: Mood and affect normal.        Speech: Speech normal.        Behavior: Behavior normal. Behavior is cooperative.        Thought  Content: Thought content normal.        Cognition and Memory: Cognition and memory normal.        Judgment: Judgment normal.     Results for orders placed or performed in visit on 08/18/18  Urine Culture   Specimen: Urine   URINE  Result Value Ref Range   Urine Culture, Routine Final report    Organism ID, Bacteria Comment   Microscopic Examination   URINE  Result Value Ref Range   WBC, UA 0-5 0 - 5 /hpf   RBC, UA None seen 0 - 2 /hpf   Epithelial Cells (non renal) 0-10 0 - 10 /hpf   Renal Epithel, UA None seen None seen /hpf   Bacteria, UA Moderate (A) None seen/Few  Urinalysis, Complete  Result Value Ref Range   Specific Gravity, UA 1.010 1.005 - 1.030   pH, UA 7.0 5.0 - 7.5   Color, UA Yellow Yellow   Appearance Ur Clear Clear   Leukocytes, UA Negative Negative   Protein, UA Negative Negative/Trace   Glucose, UA Negative Negative   Ketones, UA Negative Negative   RBC, UA Negative Negative   Bilirubin, UA Negative Negative   Urobilinogen, Ur 0.2 0.2 - 1.0 mg/dL   Nitrite, UA Negative Negative   Microscopic Examination See below:      Ear Cerumen Removal  Date/Time: 01/24/2020 8:37 AM Performed by: Baruch Gouty, FNP Authorized by: Baruch Gouty, FNP   Anesthesia: Local Anesthetic: none Location details: left ear Patient tolerance: patient tolerated the procedure well with no immediate complications Comments: TM WNL post irrigation.  Procedure type: irrigation  Sedation: Patient sedated: no      Pertinent labs & imaging results that were available during my care of the patient were reviewed by me and considered in my medical decision making.  Assessment & Plan:  Erica Murphy was seen today for medical management of  chronic issues.  Diagnoses and all orders for this visit:  Elevated blood-pressure reading, without diagnosis of hypertension BP reading normal today. DASH diet discussed in detail. Pt aware to keep a log and report any persistent high  readings. Labs pending.  -     CMP14+EGFR -     CBC with Differential/Platelet -     Lipid panel -     Thyroid Panel With TSH  BMI 30.0-30.9,adult Diet and exercise encouraged. Labs pending.  -     CMP14+EGFR -     CBC with Differential/Platelet -     Lipid panel -     Thyroid Panel With TSH -     VITAMIN D 25 Hydroxy (Vit-D Deficiency, Fractures)  Other microscopic hematuria Will recheck urine today. Pt asymptomatic.  -     Urinalysis, Complete  Vitamin D deficiency Has not been taking repletion therapy. Will start 2000 units daily. Labs pending, will change therapy if warranted.  -     VITAMIN D 25 Hydroxy (Vit-D Deficiency, Fractures)  Left ear impacted cerumen -     Ear Cerumen Removal     Continue all other maintenance medications.  Follow up plan: Return if symptoms worsen or fail to improve.   Medical decision-making:   25 minutes spent today reviewing the medical chart, counseling the patient/family, and documenting today's visit.  Continue healthy lifestyle choices, including diet (rich in fruits, vegetables, and lean proteins, and low in salt and simple carbohydrates) and exercise (at least 30 minutes of moderate physical activity daily).  Educational handout given for health maintenance  The above assessment and management plan was discussed with the patient. The patient verbalized understanding of and has agreed to the management plan. Patient is aware to call the clinic if they develop any new symptoms or if symptoms persist or worsen. Patient is aware when to return to the clinic for a follow-up visit. Patient educated on when it is appropriate to go to the emergency department.   Monia Pouch, FNP-C [facility] [phone]

## 2020-01-24 NOTE — Patient Instructions (Signed)
Health Maintenance, Female Adopting a healthy lifestyle and getting preventive care are important in promoting health and wellness. Ask your health care provider about:  The right schedule for you to have regular tests and exams.  Things you can do on your own to prevent diseases and keep yourself healthy. What should I know about diet, weight, and exercise? Eat a healthy diet   Eat a diet that includes plenty of vegetables, fruits, low-fat dairy products, and lean protein.  Do not eat a lot of foods that are high in solid fats, added sugars, or sodium. Maintain a healthy weight Body mass index (BMI) is used to identify weight problems. It estimates body fat based on height and weight. Your health care provider can help determine your BMI and help you achieve or maintain a healthy weight. Get regular exercise Get regular exercise. This is one of the most important things you can do for your health. Most adults should:  Exercise for at least 150 minutes each week. The exercise should increase your heart rate and make you sweat (moderate-intensity exercise).  Do strengthening exercises at least twice a week. This is in addition to the moderate-intensity exercise.  Spend less time sitting. Even light physical activity can be beneficial. Watch cholesterol and blood lipids Have your blood tested for lipids and cholesterol at 46 years of age, then have this test every 5 years. Have your cholesterol levels checked more often if:  Your lipid or cholesterol levels are high.  You are older than 46 years of age.  You are at high risk for heart disease. What should I know about cancer screening? Depending on your health history and family history, you may need to have cancer screening at various ages. This may include screening for:  Breast cancer.  Cervical cancer.  Colorectal cancer.  Skin cancer.  Lung cancer. What should I know about heart disease, diabetes, and high blood  pressure? Blood pressure and heart disease  High blood pressure causes heart disease and increases the risk of stroke. This is more likely to develop in people who have high blood pressure readings, are of African descent, or are overweight.  Have your blood pressure checked: ? Every 3-5 years if you are 18-39 years of age. ? Every year if you are 40 years old or older. Diabetes Have regular diabetes screenings. This checks your fasting blood sugar level. Have the screening done:  Once every three years after age 40 if you are at a normal weight and have a low risk for diabetes.  More often and at a younger age if you are overweight or have a high risk for diabetes. What should I know about preventing infection? Hepatitis B If you have a higher risk for hepatitis B, you should be screened for this virus. Talk with your health care provider to find out if you are at risk for hepatitis B infection. Hepatitis C Testing is recommended for:  Everyone born from 1945 through 1965.  Anyone with known risk factors for hepatitis C. Sexually transmitted infections (STIs)  Get screened for STIs, including gonorrhea and chlamydia, if: ? You are sexually active and are younger than 46 years of age. ? You are older than 46 years of age and your health care provider tells you that you are at risk for this type of infection. ? Your sexual activity has changed since you were last screened, and you are at increased risk for chlamydia or gonorrhea. Ask your health care provider if   you are at risk.  Ask your health care provider about whether you are at high risk for HIV. Your health care provider may recommend a prescription medicine to help prevent HIV infection. If you choose to take medicine to prevent HIV, you should first get tested for HIV. You should then be tested every 3 months for as long as you are taking the medicine. Pregnancy  If you are about to stop having your period (premenopausal) and  you may become pregnant, seek counseling before you get pregnant.  Take 400 to 800 micrograms (mcg) of folic acid every day if you become pregnant.  Ask for birth control (contraception) if you want to prevent pregnancy. Osteoporosis and menopause Osteoporosis is a disease in which the bones lose minerals and strength with aging. This can result in bone fractures. If you are 65 years old or older, or if you are at risk for osteoporosis and fractures, ask your health care provider if you should:  Be screened for bone loss.  Take a calcium or vitamin D supplement to lower your risk of fractures.  Be given hormone replacement therapy (HRT) to treat symptoms of menopause. Follow these instructions at home: Lifestyle  Do not use any products that contain nicotine or tobacco, such as cigarettes, e-cigarettes, and chewing tobacco. If you need help quitting, ask your health care provider.  Do not use street drugs.  Do not share needles.  Ask your health care provider for help if you need support or information about quitting drugs. Alcohol use  Do not drink alcohol if: ? Your health care provider tells you not to drink. ? You are pregnant, may be pregnant, or are planning to become pregnant.  If you drink alcohol: ? Limit how much you use to 0-1 drink a day. ? Limit intake if you are breastfeeding.  Be aware of how much alcohol is in your drink. In the [place] one drink equals one 12 oz bottle of beer (355 mL), one 5 oz glass of wine (148 mL), or one 1 oz glass of hard liquor (44 mL). General instructions  Schedule regular health, dental, and eye exams.  Stay current with your vaccines.  Tell your health care provider if: ? You often feel depressed. ? You have ever been abused or do not feel safe at home. Summary  Adopting a healthy lifestyle and getting preventive care are important in promoting health and wellness.  Follow your health care provider's instructions about healthy  diet, exercising, and getting tested or screened for diseases.  Follow your health care provider's instructions on monitoring your cholesterol and blood pressure. This information is not intended to replace advice given to you by your health care provider. Make sure you discuss any questions you have with your health care provider. Document Revised: 10/14/2018 Document Reviewed: 10/14/2018 Elsevier Patient Education  2020 [employer]  

## 2020-01-24 NOTE — Telephone Encounter (Signed)
Patient aware of labs.  

## 2020-01-25 LAB — CMP14+EGFR
ALT: 22 IU/L (ref 0–32)
AST: 17 IU/L (ref 0–40)
Albumin/Globulin Ratio: 1.6 (ref 1.2–2.2)
Albumin: 4.5 g/dL (ref 3.8–4.8)
Alkaline Phosphatase: 71 IU/L (ref 39–117)
BUN/Creatinine Ratio: 19 (ref 9–23)
BUN: 16 mg/dL (ref 6–24)
Bilirubin Total: 0.6 mg/dL (ref 0.0–1.2)
CO2: 22 mmol/L (ref 20–29)
Calcium: 9.4 mg/dL (ref 8.7–10.2)
Chloride: 106 mmol/L (ref 96–106)
Creatinine, Ser: 0.85 mg/dL (ref 0.57–1.00)
GFR calc Af Amer: 96 mL/min/{1.73_m2} (ref >59)
GFR calc non Af Amer: 83 mL/min/{1.73_m2} (ref >59)
Globulin, Total: 2.8 g/dL (ref 1.5–4.5)
Glucose: 88 mg/dL (ref 65–99)
Potassium: 4.4 mmol/L (ref 3.5–5.2)
Sodium: 145 mmol/L — ABNORMAL HIGH (ref 134–144)
Total Protein: 7.3 g/dL (ref 6.0–8.5)

## 2020-01-25 LAB — THYROID PANEL WITH TSH
Free Thyroxine Index: 1.7 (ref 1.2–4.9)
T3 Uptake Ratio: 22 % — ABNORMAL LOW (ref 24–39)
T4, Total: 7.8 ug/dL (ref 4.5–12.0)
TSH: 2.1 u[IU]/mL (ref 0.450–4.500)

## 2020-01-25 LAB — LIPID PANEL
Chol/HDL Ratio: 3.1 ratio (ref 0.0–4.4)
Cholesterol, Total: 191 mg/dL (ref 100–199)
HDL: 62 mg/dL (ref >39)
LDL Chol Calc (NIH): 116 mg/dL — ABNORMAL HIGH (ref 0–99)
Triglycerides: 71 mg/dL (ref 0–149)
VLDL Cholesterol Cal: 13 mg/dL (ref 5–40)

## 2020-01-25 LAB — CBC WITH DIFFERENTIAL/PLATELET
Basophils Absolute: 0.1 10*3/uL (ref 0.0–0.2)
Basos: 1 %
EOS (ABSOLUTE): 0.1 10*3/uL (ref 0.0–0.4)
Eos: 2 %
Hematocrit: 39.6 % (ref 34.0–46.6)
Hemoglobin: 13.3 g/dL (ref 11.1–15.9)
Immature Grans (Abs): 0 10*3/uL (ref 0.0–0.1)
Immature Granulocytes: 0 %
Lymphocytes Absolute: 1.8 10*3/uL (ref 0.7–3.1)
Lymphs: 28 %
MCH: 29.3 pg (ref 26.6–33.0)
MCHC: 33.6 g/dL (ref 31.5–35.7)
MCV: 87 fL (ref 79–97)
Monocytes Absolute: 0.6 10*3/uL (ref 0.1–0.9)
Monocytes: 9 %
Neutrophils Absolute: 3.8 10*3/uL (ref 1.4–7.0)
Neutrophils: 60 %
Platelets: 295 10*3/uL (ref 150–450)
RBC: 4.54 x10E6/uL (ref 3.77–5.28)
RDW: 12.7 % (ref 11.7–15.4)
WBC: 6.3 10*3/uL (ref 3.4–10.8)

## 2020-01-25 LAB — VITAMIN D 25 HYDROXY (VIT D DEFICIENCY, FRACTURES): Vit D, 25-Hydroxy: 22.3 ng/mL — ABNORMAL LOW (ref 30.0–100.0)

## 2020-03-14 ENCOUNTER — Other Ambulatory Visit: Payer: Self-pay

## 2020-03-14 ENCOUNTER — Encounter: Payer: Self-pay | Admitting: Urology

## 2020-03-14 ENCOUNTER — Ambulatory Visit: Payer: BC Managed Care – PPO | Admitting: Urology

## 2020-03-14 ENCOUNTER — Other Ambulatory Visit (HOSPITAL_COMMUNITY)
Admission: RE | Admit: 2020-03-14 | Discharge: 2020-03-14 | Disposition: A | Discharge status: Home / Self Care | Payer: BC Managed Care – PPO | Source: Other Acute Inpatient Hospital | Attending: Urology | Admitting: Urology

## 2020-03-14 VITALS — BP 135/82 | HR 72 | Temp 99.3°F | Ht 63.0 in | Wt 170.0 lb

## 2020-03-14 DIAGNOSIS — R319 Hematuria, unspecified: Secondary | ICD-10-CM | POA: Diagnosis not present

## 2020-03-14 LAB — URINALYSIS, ROUTINE W REFLEX MICROSCOPIC
Bilirubin Urine: NEGATIVE
Glucose, UA: NEGATIVE mg/dL
Ketones, ur: NEGATIVE mg/dL
Leukocytes,Ua: NEGATIVE
Nitrite: NEGATIVE
Protein, ur: NEGATIVE mg/dL
Specific Gravity, Urine: 1.02 (ref 1.005–1.030)
pH: 7 (ref 5.0–8.0)

## 2020-03-14 LAB — POCT URINALYSIS DIPSTICK
Bilirubin, UA: NEGATIVE
Glucose, UA: NEGATIVE
Ketones, UA: NEGATIVE
Leukocytes, UA: NEGATIVE
Nitrite, UA: NEGATIVE
Protein, UA: NEGATIVE
Spec Grav, UA: 1.015 (ref 1.010–1.025)
Urobilinogen, UA: 0.2 E.U./dL
pH, UA: 6 (ref 5.0–8.0)

## 2020-03-14 NOTE — Progress Notes (Signed)
H&P  Chief Complaint: Micro Hematuria  History of Present Illness:  5.11.2021: Erica Murphy is a 44 y.o. year old female referred today for evaluation of recent micro hematuria. This was most recently found at yearly check-in with [facility] though she does think she had it on a UA around 1-2 years ago. She denies ever having had gross hematuria. She denies any past tobacco use. She also denies any issues with recurrent UTI's. Of note, she was feeling as though she could have had a UTI around time of her most UA with micro hematuria (severe lower abdominal pain and some difficulties urinating) but reports she was instead constipated.   Past Medical History:  Diagnosis Date  . Allergy   . Breast cancer [facility])     Past Surgical History:  Procedure Laterality Date  . BREAST SURGERY Bilateral    Masectomy  . CESAREAN SECTION    . MASTECTOMY Bilateral 2004/2005    Home Medications:  (Not in a hospital admission)   Allergies: No Known Allergies  Family History  Problem Relation Age of Onset  . Arthritis Mother     Social History:  reports that she has never smoked. She has never used smokeless tobacco. She reports that she does not drink alcohol or use drugs.  ROS: Urological Symptom Review  Patient is experiencing the following symptoms: Leakage of urine Blood in urine   Review of Systems  Gastrointestinal (upper)  : Negative for upper GI symptoms Gastrointestinal (lower) : Negative for lower GI symptoms Constitutional : Negative for symptoms Skin: Negative for skin symptoms Eyes: Negative for eye symptoms Ear/Nose/Throat : Negative for Ear/Nose/Throat symptoms Hematologic/Lymphatic: Negative for Hematologic/Lymphatic symptoms Cardiovascular : Negative for cardiovascular symptoms Respiratory : Negative for respiratory symptoms Endocrine: Negative for endocrine symptoms Musculoskeletal: Negative for musculoskeletal  symptoms Neurological: Negative for neurological symptoms Psychologic: Negative for psychiatric symptoms   Physical Exam:  Vital signs in last 24 hours: Temp:  [99.3 F (37.4 C)] 99.3 F (37.4 C) (05/11 1455) Pulse Rate:  [72] 72 (05/11 1455) BP: (135)/(82) 135/82 (05/11 1455) Weight:  [170 lb (77.1 kg)] 170 lb (77.1 kg) (05/11 1455) General:  Alert and oriented, No acute distress HEENT: Normocephalic, atraumatic Neck: No JVD or lymphadenopathy Cardiovascular: Regular rate  Lungs: Normal inspiratory/expiratory excursion Extremities: No edema Neurologic: Grossly intact  Laboratory Data:  Results for orders placed or performed in visit on 03/14/20 (from the past 24 hour(s))  POCT urinalysis dipstick     Status: None   Collection Time: 03/14/20  3:00 PM  Result Value Ref Range   Color, UA yellow    Clarity, UA     Glucose, UA Negative Negative   Bilirubin, UA neg    Ketones, UA neg    Spec Grav, UA 1.015 1.010 - 1.025   Blood, UA hemo trace    pH, UA 6.0 5.0 - 8.0   Protein, UA Negative Negative   Urobilinogen, UA 0.2 0.2 or 1.0 E.U./dL   Nitrite, UA neg    Leukocytes, UA Negative Negative   Appearance hazy    Odor     I have reviewed prior pt notes  I have reviewed notes from referring/previous physicians  I have reviewed urinalysis results    Impression/Assessment:  She has had micro hematuria on two annual UA's but has no high risk hx (no tobacco use, no chemical exposures, etc.). Will check micro UA and plan follow-up pending those results. Will only move forward with cysto and CT if  needed.   Plan:  1. Micro UA -- will forward results  2. Pending results, will plan appropriate follow-up including cysto and CT if needed.   Dena Billet 03/14/2020, 3:36 PM  Lillette Boxer. Dahlstedt MD

## 2020-03-14 NOTE — Addendum Note (Signed)
Addended by: Dorisann Frames on: 03/14/2020 03:54 PM   Modules accepted: Orders

## 2020-03-14 NOTE — Addendum Note (Signed)
Addended by: Dorisann Frames on: 03/14/2020 03:57 PM   Modules accepted: Orders

## 2020-03-14 NOTE — Progress Notes (Signed)
See progress notes

## 2020-03-16 NOTE — Progress Notes (Signed)
Letter sent via my chart

## 2020-11-17 ENCOUNTER — Ambulatory Visit (INDEPENDENT_AMBULATORY_CARE_PROVIDER_SITE_OTHER): Payer: BC Managed Care – PPO | Admitting: Family

## 2020-11-17 ENCOUNTER — Encounter: Payer: Self-pay | Admitting: Family

## 2020-11-17 DIAGNOSIS — U071 COVID-19: Secondary | ICD-10-CM | POA: Diagnosis not present

## 2020-11-17 MED ORDER — ALBUTEROL SULFATE HFA 108 (90 BASE) MCG/ACT IN AERS: 2.0000 | INHALATION_SPRAY | Freq: Four times a day (QID) | RESPIRATORY_TRACT | 2 refills | Status: DC | PRN

## 2020-11-17 MED ORDER — BENZONATATE 200 MG PO CAPS: 200.0000 mg | ORAL_CAPSULE | Freq: Three times a day (TID) | ORAL | 1 refills | Status: DC | PRN

## 2020-11-17 MED ORDER — DEXAMETHASONE 6 MG PO TABS: 6.0000 mg | ORAL_TABLET | Freq: Two times a day (BID) | ORAL | 0 refills | Status: DC

## 2020-11-17 NOTE — Progress Notes (Signed)
Virtual Visit via telephone Note Due to COVID-19 pandemic this visit was conducted virtually. This visit type was conducted due to national recommendations for restrictions regarding the COVID-19 Pandemic (e.g. social distancing, sheltering in place) in an effort to limit this patient's exposure and mitigate transmission in our community. All issues noted in this document were discussed and addressed.  A physical exam was not performed with this format.  I connected with Erica Murphy on 11/17/20 at 12:55 pm  by telephone and verified that I am speaking with the correct person using two identifiers. Erica Murphy is currently located at home and no one is currently with her during visit. The provider, Evelina Dun, FNP is located in their office at time of visit.  I discussed the limitations, risks, security and privacy concerns of performing an evaluation and management service by telephone and the availability of in person appointments. I also discussed with the patient that there may be a patient responsible charge related to this service. The patient expressed understanding and agreed to proceed.   History and Present Illness:   Pt calls the office today for cough, fever, chill, and headache that started yesterday. She did an at home test that was positive for COVID.  Cough This is a new problem. The current episode started yesterday. The problem has been gradually worsening. The problem occurs every few minutes. The cough is non-productive. Associated symptoms include chills, ear congestion, ear pain, a fever, myalgias, nasal congestion, postnasal drip, a sore throat, shortness of breath and wheezing. She has tried rest for the symptoms. The treatment provided mild relief.    Review of Systems  Constitutional: Positive for chills and fever.  HENT: Positive for ear pain, postnasal drip and sore throat.   Respiratory: Positive for cough, shortness of breath and wheezing.    Musculoskeletal: Positive for myalgias.     Observations/Objective: NO SOB or distress noted   Assessment and Plan: 1. COVID-19 virus detected Rest Force fluids Tylenol  Quarantine for up to 5 days from the start of your symptoms Call if symptoms worsen or do not improve  - MyChart COVID-19 home monitoring program; Future - dexamethasone (DECADRON) 6 MG tablet; Take 1 tablet (6 mg total) by mouth 2 (two) times daily.  Dispense: 10 tablet; Refill: 0 - albuterol (VENTOLIN HFA) 108 (90 Base) MCG/ACT inhaler; Inhale 2 puffs into the lungs every 6 (six) hours as needed for wheezing or shortness of breath.  Dispense: 8 g; Refill: 2 - benzonatate (TESSALON) 200 MG capsule; Take 1 capsule (200 mg total) by mouth 3 (three) times daily as needed.  Dispense: 30 capsule; Refill: 1     I discussed the assessment and treatment plan with the patient. The patient was provided an opportunity to ask questions and all were answered. The patient agreed with the plan and demonstrated an understanding of the instructions.   The patient was advised to call back or seek an in-person evaluation if the symptoms worsen or if the condition fails to improve as anticipated.  The above assessment and management plan was discussed with the patient. The patient verbalized understanding of and has agreed to the management plan. Patient is aware to call the clinic if symptoms persist or worsen. Patient is aware when to return to the clinic for a follow-up visit. Patient educated on when it is appropriate to go to the emergency department.   Time call ended:  1:06 pm   I provided 11 minutes of non-face-to-face time during this  encounter.    Evelina Dun, FNP

## 2021-02-22 LAB — HM COLONOSCOPY

## 2023-01-08 ENCOUNTER — Encounter: Payer: Self-pay | Admitting: Family Medicine

## 2023-01-08 ENCOUNTER — Ambulatory Visit: Payer: BC Managed Care – PPO | Admitting: Family Medicine

## 2023-01-08 VITALS — BP 121/79 | HR 74 | Wt 181.0 lb

## 2023-01-08 DIAGNOSIS — K219 Gastro-esophageal reflux disease without esophagitis: Secondary | ICD-10-CM

## 2023-01-08 DIAGNOSIS — L731 Pseudofolliculitis barbae: Secondary | ICD-10-CM

## 2023-01-08 DIAGNOSIS — L249 Irritant contact dermatitis, unspecified cause: Secondary | ICD-10-CM

## 2023-01-08 NOTE — Progress Notes (Signed)
BP 121/79   Pulse 74   Wt 181 lb (82.1 kg)   LMP 01/23/2018   SpO2 98%   BMI 32.06 kg/m    Subjective:   Patient ID: Erica Murphy, female    DOB: 06-05-1976, 47 y.o.   MRN: [id number]  HPI: Erica Murphy is a 47 y.o. female presenting on 01/08/2023 for Rash (RUA- red, raised. Present few weeks) and Abdominal Pain (Epi, comes and goes. ? Acid refulx)   HPI Rash Patient is coming with a rash on the dorsal side of her hand and wrist that she says started up over a couple weeks ago.  She says there was some small bumps and it was not as itchy itchy and pink and raised.  She says the ones on the right hand that started to get better.  She has not used anything on the rash and it does seem to be getting better on its own.  Ingrown hair Patient has small ingrown hair or boil on her right leg from shaving.  She says the small just wanted to mention it but not really hurting.  Epigastric discomfort Patient describes epigastric discomfort that has been bothering her over the past few months.  She says it comes and goes but has been worse over the past couple weeks.  She says she will get acid and wake up with acid in the back of her throat especially at night.  We discussed dietary changes and she does have some caffeine but does not drink alcohol and does not eat late at night and does not have tomato products or acidic foods or spicy foods regularly.  She says it hurts randomly and is not after meals or before meals necessarily.  Relevant past medical, surgical, family and social history reviewed and updated as indicated. Interim medical history since our last visit reviewed. Allergies and medications reviewed and updated.  Review of Systems  Constitutional:  Negative for chills and fever.  Eyes:  Negative for visual disturbance.  Respiratory:  Negative for chest tightness and shortness of breath.   Cardiovascular:  Negative for chest pain and leg swelling.  Skin:  Negative for rash.   Neurological:  Negative for light-headedness and headaches.  Psychiatric/Behavioral:  Negative for agitation and behavioral problems.   All other systems reviewed and are negative.   Per HPI unless specifically indicated above   Allergies as of 01/08/2023   No Known Allergies      Medication List        Accurate as of January 08, 2023  9:41 AM. If you have any questions, ask your nurse or doctor.          STOP taking these medications    albuterol 108 (90 Base) MCG/ACT inhaler Commonly known as: VENTOLIN HFA Stopped by: Fransisca Kaufmann Kwanza Cancelliere, MD   benzonatate 200 MG capsule Commonly known as: TESSALON Stopped by: Worthy Rancher, MD   dexamethasone 6 MG tablet Commonly known as: DECADRON Stopped by: Fransisca Kaufmann Teoman Giraud, MD       TAKE these medications    Calcium 500-2.5 MG-MCG Chew Chew 2 Doses by mouth daily.   cetirizine 10 MG tablet Commonly known as: ZYRTEC Take 1 tablet (10 mg total) by mouth daily. What changed:  when to take this reasons to take this   polyethylene glycol powder 17 GM/SCOOP powder Commonly known as: GLYCOLAX/MIRALAX Take 17 g by mouth 2 (two) times daily as needed.         Objective:  BP 121/79   Pulse 74   Wt 181 lb (82.1 kg)   LMP 01/23/2018   SpO2 98%   BMI 32.06 kg/m   Wt Readings from Last 3 Encounters:  01/08/23 181 lb (82.1 kg)  03/14/20 170 lb (77.1 kg)  01/24/20 171 lb (77.6 kg)    Physical Exam Vitals and nursing note reviewed.  Constitutional:      General: She is not in acute distress.    Appearance: She is well-developed. She is not diaphoretic.  Eyes:     Conjunctiva/sclera: Conjunctivae normal.  Abdominal:     General: Abdomen is flat. Bowel sounds are normal. There is no distension.     Palpations: Abdomen is soft.     Tenderness: There is abdominal tenderness in the epigastric area. There is no right CVA tenderness, left CVA tenderness, guarding or rebound.  Musculoskeletal:        General:  Normal range of motion.  Skin:    General: Skin is warm and dry.     Findings: No rash.  Neurological:     Mental Status: She is alert and oriented to person, place, and time.     Coordination: Coordination normal.  Psychiatric:        Behavior: Behavior normal.       Assessment & Plan:   Problem List Items Addressed This Visit   None Visit Diagnoses     Irritant contact dermatitis, unspecified trigger    -  Primary   Gastroesophageal reflux disease without esophagitis       Ingrown hair           For contact dermatitis recommended topical hydrocortisone cream and to watch for any possible irritants that she comes in contact with.  For GERD recommended over-the-counter Pepcid to help calm it down and gave him some dietary strategies.  For ingrown hair recommended topical antibiotic cream twice daily and let us know if anything worsens. Follow up plan: Return if symptoms worsen or fail to improve.  Counseling provided for all of the vaccine components No orders of the defined types were placed in this encounter.   Caryl Pina, MD [facility] 01/08/2023, 9:41 AM

## 2023-01-08 NOTE — Patient Instructions (Signed)
Contact Dermatitis Dermatitis is when your skin becomes red, sore, and swollen.  Contact dermatitis happens when your body reacts to something that touches the skin. There are 2 types: Irritant contact dermatitis. This is when something bothers your skin, like soap. Allergic contact dermatitis. This is when your skin touches something you are allergic to, like poison ivy. What are the causes? Irritant contact dermatitis may be caused by: Makeup. Soaps. Detergents. Bleaches. Acids. Metals, like nickel. Allergic contact dermatitis may be caused by: Plants. Chemicals. Jewelry. Latex. Medicines. Preservatives. These are things added to products to help them last longer. There may be some in your clothes. What increases the risk? Having a job where you have to be near things that bother your skin. Having asthma or eczema. What are the signs or symptoms?  Dry or flaky skin. Redness. Cracks. Itching. Moderate symptoms of this condition include: Pain or a burning feeling. Blisters. Blood or clear fluid coming from cracks in your skin. Swelling. This may be on your eyelids, mouth, or genitals. How is this treated? Your doctor will find out what is making your skin react. Then, you can protect your skin. You may need to use: Steroid creams, ointments, or medicines. Antibiotics or other ointments, if you have a skin infection. Lotion or medicines to help with itching. A bandage. Follow these instructions at home: Skin care Put moisturizer on your skin when it needs it. Put cool, wet cloths on your skin (cool compresses). Put a baking soda paste on your skin. Stir water into baking soda until it looks like a paste. Do not scratch your skin. Try not to have things rub up against your skin. Avoid tight clothing. Avoid using soaps, perfumes, and dyes. Check your skin every day for signs of infection. Check for: More redness, swelling, or pain. More fluid or blood. Warmth. Pus or  a bad smell. Medicines Take or apply over-the-counter and prescription medicines only as told by your doctor. If you were prescribed antibiotics, take or apply them as told by your doctor. Do not stop using them even if you start to feel better. Bathing Take a bath with: Epsom salts. Baking soda. Colloidal oatmeal. Bathe less often. Bathe in warm water. Try not to use hot water. Bandage care If you were given a bandage, change it as told by your doctor. Wash your hands with soap and water for at least 20 seconds before and after you change your bandage. If you cannot use soap and water, use hand sanitizer. General instructions Avoid the things that caused your reaction. If you don't know what caused it, keep a journal. Write down: What you eat. What skin products you use. What you drink. What you wear. Contact a doctor if: You do not get better with treatment. You get worse. You have signs of infection. You have a fever. You have new symptoms. Your bone or joint near the area hurts after the skin has healed. Get help right away if: You see red streaks coming from the area. The area turns darker. You have trouble breathing. This information is not intended to replace advice given to you by your health care provider. Make sure you discuss any questions you have with your health care provider. Document Revised: 04/26/2022 Document Reviewed: 04/26/2022 Elsevier Patient Education  [place].

## 2023-03-11 LAB — CMP14+EGFR: EGFR: 94

## 2023-08-20 ENCOUNTER — Encounter: Payer: Self-pay | Admitting: Family Medicine

## 2023-08-20 ENCOUNTER — Ambulatory Visit: Payer: BC Managed Care – PPO | Admitting: Family Medicine

## 2023-08-20 VITALS — BP 124/83 | HR 80 | Ht 64.0 in | Wt 176.0 lb

## 2023-08-20 DIAGNOSIS — K219 Gastro-esophageal reflux disease without esophagitis: Secondary | ICD-10-CM | POA: Diagnosis not present

## 2023-08-20 NOTE — Progress Notes (Signed)
BP 124/83   Pulse 80   Ht 5\' 4"  (1.626 m)   Wt 176 lb (79.8 kg)   LMP 01/23/2018   SpO2 97%   BMI 30.21 kg/m    Subjective:   Patient ID: Erica Murphy, female    DOB: September 25, 1976, 48 y.o.   MRN: [id number]  HPI: Erica Murphy is a 47 y.o. female presenting on 08/20/2023 for Diarrhea and Abdominal Pain ("Discomfort" RUQ)   HPI Abdominal discomfort and diarrhea Patient comes in complaining of intermittent abdominal discomfort and loose stools that she has been having off and on since 2016.  She says it has been more frequent since last March but especially picked up over the past couple months.  She says at nighttime when she lays down she gets a lot more acid and feels like she going to vomit and then sometimes during the day she will feel like she has to go quickly and will have looser stools but not clear diarrhea.  She did try Pepcid in the past and it did help calm it down for a little while but then it came back.  She does admit to eating late at night does irritate her a lot more.  She describes the abdominal pain as upper and right upper and more of an ache or discomfort, not severe  Relevant past medical, surgical, family and social history reviewed and updated as indicated. Interim medical history since our last visit reviewed. Allergies and medications reviewed and updated.  Review of Systems  Constitutional:  Negative for chills and fever.  HENT:  Negative for congestion, ear discharge and ear pain.   Eyes:  Negative for redness and visual disturbance.  Respiratory:  Negative for chest tightness and shortness of breath.   Cardiovascular:  Negative for chest pain and leg swelling.  Gastrointestinal:  Positive for abdominal pain, diarrhea, nausea and vomiting. Negative for abdominal distention, blood in stool and constipation.  Genitourinary:  Negative for difficulty urinating and dysuria.  Musculoskeletal:  Negative for back pain and gait problem.  Skin:  Negative for  rash.  Neurological:  Negative for light-headedness and headaches.  Psychiatric/Behavioral:  Negative for agitation and behavioral problems.   All other systems reviewed and are negative.   Per HPI unless specifically indicated above   Allergies as of 08/20/2023   No Known Allergies      Medication List        Accurate as of August 20, 2023  9:38 AM. If you have any questions, ask your nurse or doctor.          STOP taking these medications    polyethylene glycol powder 17 GM/SCOOP powder Commonly known as: GLYCOLAX/MIRALAX Stopped by: Elige Radon Aaralyn Kil       TAKE these medications    Calcium 500-2.5 MG-MCG Chew Chew 2 Doses by mouth daily.   cetirizine 10 MG tablet Commonly known as: ZYRTEC Take 1 tablet (10 mg total) by mouth daily. What changed:  when to take this reasons to take this         Objective:   BP 124/83   Pulse 80   Ht 5\' 4"  (1.626 m)   Wt 176 lb (79.8 kg)   LMP 01/23/2018   SpO2 97%   BMI 30.21 kg/m   Wt Readings from Last 3 Encounters:  08/20/23 176 lb (79.8 kg)  01/08/23 181 lb (82.1 kg)  03/14/20 170 lb (77.1 kg)    Physical Exam Vitals and nursing note reviewed.  Constitutional:  General: She is not in acute distress.    Appearance: She is well-developed. She is not diaphoretic.  Eyes:     Conjunctiva/sclera: Conjunctivae normal.  Abdominal:     General: Abdomen is flat. Bowel sounds are normal. There is no distension.     Palpations: Abdomen is soft.     Tenderness: There is abdominal tenderness in the right upper quadrant and epigastric area. There is no right CVA tenderness, left CVA tenderness, guarding or rebound.  Musculoskeletal:        General: No tenderness. Normal range of motion.  Skin:    General: Skin is warm and dry.     Findings: No rash.  Neurological:     Mental Status: She is alert and oriented to person, place, and time.     Coordination: Coordination normal.  Psychiatric:         Behavior: Behavior normal.       Assessment & Plan:   Problem List Items Addressed This Visit   None Visit Diagnoses     Gastroesophageal reflux disease without esophagitis    -  Primary       Patient has chronic off-and-on GERD, she already has gastroenterologist recommended that she call them up to do a possible endoscopy.  Recommended that she go ahead and try the Pepcid over-the-counter again for couple weeks to see if she can calm it down but it is not postmeal it does not sound like cholecystitis.  With her having been dealing with this off and on since at least 2016 I encouraged her to look into whether she is a candidate for EGD Follow up plan: Return if symptoms worsen or fail to improve.  Counseling provided for all of the vaccine components No orders of the defined types were placed in this encounter.   Arville Care, MD [facility] 08/20/2023, 9:38 AM

## 2023-12-05 ENCOUNTER — Telehealth: Payer: 59 | Admitting: Family Medicine

## 2023-12-05 DIAGNOSIS — J101 Influenza due to other identified influenza virus with other respiratory manifestations: Secondary | ICD-10-CM | POA: Diagnosis not present

## 2023-12-05 MED ORDER — OSELTAMIVIR PHOSPHATE 75 MG PO CAPS: 75.0000 mg | ORAL_CAPSULE | Freq: Two times a day (BID) | ORAL | 0 refills | Status: AC

## 2023-12-05 NOTE — Progress Notes (Signed)
